# Patient Record
Sex: Male | Born: 1995
Health system: Southern US, Community
[De-identification: ages and names within clinical notes are randomized; demographics above are authoritative.]

## PROBLEM LIST (undated history)

## (undated) DIAGNOSIS — I10 Essential (primary) hypertension: Secondary | ICD-10-CM

## (undated) HISTORY — PX: TONSILLECTOMY AND ADENOIDECTOMY: SUR1326

---
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
## 2015-08-01 ENCOUNTER — Ambulatory Visit (INDEPENDENT_AMBULATORY_CARE_PROVIDER_SITE_OTHER): Payer: BLUE CROSS/BLUE SHIELD | Admitting: Primary Care

## 2015-08-01 ENCOUNTER — Encounter: Payer: Self-pay | Admitting: Primary Care

## 2015-08-01 VITALS — BP 136/84 | HR 83 | Temp 97.9°F | Ht 71.0 in | Wt 162.1 lb

## 2015-08-01 DIAGNOSIS — S76301A Unspecified injury of muscle, fascia and tendon of the posterior muscle group at thigh level, right thigh, initial encounter: Secondary | ICD-10-CM

## 2015-08-01 DIAGNOSIS — T1490XA Injury, unspecified, initial encounter: Secondary | ICD-10-CM | POA: Insufficient documentation

## 2015-08-01 MED ORDER — TRAMADOL HCL 50 MG PO TABS: 50.0000 mg | ORAL_TABLET | Freq: Three times a day (TID) | ORAL | Status: DC | PRN

## 2015-08-01 NOTE — Patient Instructions (Addendum)
Please schedule an appointment with Dr. Patsy Lager tomorrow.  Keep the ACE bandage around your leg for compression.  Apply ice and elevate your leg.   You can take Tramadol three times daily as needed for pain.  Please stay off of your leg today if possible. If the pain gets worse, you noticed an increase in bulge then go to the emergency department.  Please schedule a physical with me in 3 months. You may also schedule a lab only appointment 3-4 days prior. We will discuss your lab results in detail during your physical.  It was a pleasure to meet you today! Please don't hesitate to call me with any questions. Welcome to [facility]!

## 2015-08-01 NOTE — Progress Notes (Signed)
   Subjective:    Patient ID: Anthony Hendricks, male    DOB: 1995-11-22, 20 y.o.   MRN: [id number]  HPI  Anthony Hendricks is a 20 year old male who presents today to establish care and discuss the problems mentioned below. Will obtain old records. His last physical was years ago.   1) Leg pain: Located to the right upper left leg, hamstring region. He first noticed his pain with swelling suddenly after turning incorrectly while playing basketball this past Sunday.  He continued to apply weight on his leg that day and has since. He's noticed increased swelling and pain with a small "knot" to the posterior surface. He develops pain with any pressure or touch to his leg. He's tried applying ice and stretching. He's taken advil/ibuprofen without improvement. His pain is worse.  Review of Systems  Constitutional: Negative for unexpected weight change.  HENT: Negative for rhinorrhea.   Respiratory: Negative for cough and shortness of breath.   Cardiovascular: Negative for chest pain.  Gastrointestinal: Negative for diarrhea and constipation.  Genitourinary: Negative for difficulty urinating.  Musculoskeletal:       Right hamstring pain, swelling.  Skin: Negative for rash.  Neurological: Negative for numbness.       No past medical history on file.  Social History   Social History  . Marital Status: Single    Spouse Name: N/A  . Number of Children: N/A  . Years of Education: N/A   Occupational History  . Not on file.   Social History Main Topics  . Smoking status: Never Smoker   . Smokeless tobacco: Not on file  . Alcohol Use: 0.0 oz/week    0 Standard drinks or equivalent per week     Comment: social  . Drug Use: No  . Sexual Activity: Not on file   Other Topics Concern  . Not on file   Social History Narrative   Single.   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   Highest level of education 10th grade.   Enjoys playing basketball.    Past Surgical History  Procedure Laterality Date    . Tonsillectomy and adenoidectomy      Family History  Problem Relation Age of Onset  . Diabetes Mother   . Diabetes Sister     No Known Allergies  No current outpatient prescriptions on file prior to visit.   No current facility-administered medications on file prior to visit.    BP 136/84 mmHg  Pulse 83  Temp(Src) 97.9 F (36.6 C) (Oral)  Ht  (1.803 m)  Wt 162 lb 1.9 oz (73.537 kg)  BMI 22.62 kg/m2  SpO2 98%    Objective:   Physical Exam  Constitutional: He is oriented to person, place, and time. He appears well-nourished.  Appears in moderate pain with right leg injury  Cardiovascular: Normal rate and regular rhythm.   Pulmonary/Chest: Effort normal and breath sounds normal.  Musculoskeletal:       Right upper leg: He exhibits tenderness, swelling and deformity.  Obvious 3 cm bulge to right posterior upper leg. Represents injured muscle. Tender upon palpation.  Neurological: He is alert and oriented to person, place, and time.  Skin: Skin is warm and dry. No erythema.  Psychiatric: He has a normal mood and affect.          Assessment & Plan:

## 2015-08-01 NOTE — Progress Notes (Signed)
Pre visit review using our clinic review tool, if applicable. No additional management support is needed unless otherwise documented below in the visit note. 

## 2015-08-01 NOTE — Assessment & Plan Note (Signed)
Present to right posterior upper leg (hamstring region) since playing basketball.  Stat referral to ortho placed today and appointment at 2:30 pm this afternoon. He then reports that he cannot afford to see a specialist, and also declines evaluation at hospital with [place] order.  Offered for him to see Dr. Patsy Lager tomorrow, although I stressed that he see the orthopedist today, or at least allow me to order an ultrasound. He continues to decline but is ok to see Dr. Patsy Lager tomorrow and has scheduled an appointment for 9:45 am.   Strict instructions provided to patient. Offered crutches to keep weight off of leg, he declines. Leg wrapped in an ACE bandage for support. Discussed ice and elevation. RX for Tramadol provided to use PRN. Advised he not work until see by Dr. Patsy Lager tomorrow, he refuses and will be going back to work this afternoon. Note provided requiring non weight bearing work/light duty.  ED precautions provided. He verbalized understanding of the possible severity of his condition.

## 2015-08-02 ENCOUNTER — Encounter: Payer: Self-pay | Admitting: Family Medicine

## 2015-08-02 ENCOUNTER — Ambulatory Visit (INDEPENDENT_AMBULATORY_CARE_PROVIDER_SITE_OTHER): Payer: BLUE CROSS/BLUE SHIELD | Admitting: Family Medicine

## 2015-08-02 VITALS — BP 128/80 | HR 104 | Temp 98.2°F | Ht 71.0 in | Wt 164.2 lb

## 2015-08-02 DIAGNOSIS — S76311A Strain of muscle, fascia and tendon of the posterior muscle group at thigh level, right thigh, initial encounter: Secondary | ICD-10-CM | POA: Diagnosis not present

## 2015-08-02 NOTE — Patient Instructions (Signed)
Start in about 7-10 days  Hamstring Rehab 1)  HS curls - start with 3 sets of 15 and progress to 3 sets of 30 every 3 days 2)  HS curls with weight - begin with 2 lb ankle weight when above is easy and start with 3 sets of 10; increasing every 5 days by 5 reps - eg to 3 sets of 15 reps 3)  HS swings - swing leg backwards and curl at the end of the swing; follow same schedule as above. 4)  HS running lunges - running lunge position means no more than 45 degrees of knee flexion and running motion.  follow same schedule as above.

## 2015-08-02 NOTE — Progress Notes (Signed)
Dr. Karleen Hampshire T. Deshaun Schou, MD, CAQ [facility] [address] [place], [address] Phone: [phone] Fax: [phone]  08/02/2015  Patient: Arul Farabee, MRN: [id number], DOB: June 27, 1995, 20 y.o.  Primary Physician:  Morrie Sheldon, NP   Chief Complaint  Patient presents with  . Muscle Injury    Right Leg-Hurt while playing basketball   Subjective:   Kurk Corniel is a 21 y.o. 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 Felt a lump immediately, but he was able to continue playing basketball and finished the game.   Next day, he went to work. Leg pain got 10 x worse. "Got horrible, bad."  He has had minimal bruising, and pain is centered around the small medial defect on the right. He is limping currently.   He has been icing a lot and adding compression with an ace bandage.   Past Medical History, Surgical History, Social History, Family History, Problem List, Medications, and Allergies have been reviewed and updated if relevant.  Patient Active Problem List   Diagnosis Date Noted  . Muscle injury 08/01/2015    No past medical history on file.  Past Surgical History  Procedure Laterality Date  . Tonsillectomy and adenoidectomy      Social History   Social History  . Marital Status: Single    Spouse Name: N/A  . Number of Children: N/A  . Years of Education: N/A   Occupational History  . Not on file.   Social History Main Topics  . Smoking status: Never Smoker   . Smokeless tobacco: Never Used  . Alcohol Use: 0.0 oz/week    0 Standard drinks or equivalent per week     Comment: social  . Drug Use: No  . Sexual Activity: Not on file   Other Topics  Concern  . Not on file   Social History Narrative   Single.   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   Highest level of education 10th grade.   Enjoys playing basketball.    Family History  Problem Relation Age of Onset  . Diabetes Mother   . Diabetes Sister     No Known Allergies  Medication list reviewed and updated in full in [facility].  GEN: No fevers, chills. Nontoxic. Primarily MSK c/o today. MSK: Detailed in the HPI GI: tolerating PO intake without difficulty Neuro: No numbness, parasthesias, or tingling associated. Otherwise the pertinent positives of the ROS are noted above.   Objective:   BP 128/80 mmHg  Pulse 104  Temp(Src) 98.2 F (36.8 C) (Oral)  Ht  (1.803 m)  Wt 164 lb 4 oz (74.503 kg)  BMI 22.92 kg/m2   GEN: WDWN, NAD, Non-toxic, Alert & Oriented x 3 HEENT: Atraumatic, Normocephalic.  Ears and Nose: No external deformity. EXTR: No clubbing/cyanosis/edema NEURO: mild antalgia PSYCH: Normally interactive. Conversant. Not depressed or anxious appearing.  Calm demeanor.    Full ROM at the hip and knee.   Proximal HS is nontender throughout on the R The entirety of the biceps femoris is palpated, and there is no pain at all laterally whatsoever. Entirety of initial tuberosity is nontender.  Medial proximal hamstring is nontender throughout.  Distal semimembranosus and semitendinosus are also  nontender at their insertion points.  Medial hamstring is tender and there is some slight bruising adjacent to the palpable small defect is approximately 2 and a half centimeters across.  4/5 Concentric strength testing at 90. 4/5 eccentric strength testing at 25 degrees  Radiology: No results found.  Assessment and Plan:   Partial hamstring tear, right, initial encounter   Grade 2 hamstring tear, probable semitendinosis.  He has improved within a few days, and he is able to bear weight with minimal limp now. His strength from a concentric and  eccentric standpoint are better than I would have anticipated, and I think that he will do well.  Recommended neoprene compression sleeve for his thigh. I'll out to return to work, avoiding heavy lifting.  No stretching for at least 3 weeks. Basic hamstring rehabilitation in approximately 10 days, and at 19, I would expect him to do well. If he has any problems at all, asked him to follow-up with me in the upcoming weeks.  I appreciate the opportunity to evaluate this very friendly patient. If you have any question regarding her care or prognosis, do not hesitate to ask.   Follow-up: if needed  Patient Instructions  Start in about 7-10 days  Hamstring Rehab 1)  HS curls - start with 3 sets of 15 and progress to 3 sets of 30 every 3 days 2)  HS curls with weight - begin with 2 lb ankle weight when above is easy and start with 3 sets of 10; increasing every 5 days by 5 reps - eg to 3 sets of 15 reps 3)  HS swings - swing leg backwards and curl at the end of the swing; follow same schedule as above. 4)  HS running lunges - running lunge position means no more than 45 degrees of knee flexion and running motion.  follow same schedule as above.      Signed,  Elpidio Galea. Nalleli Largent, MD   Patient's Medications  New Prescriptions   No medications on file  Previous Medications   TRAMADOL (ULTRAM) 50 MG TABLET    Take 1 tablet (50 mg total) by mouth every 8 (eight) hours as needed for severe pain.  Modified Medications   No medications on file  Discontinued Medications   No medications on file

## 2015-08-02 NOTE — Progress Notes (Signed)
Pre visit review using our clinic review tool, if applicable. No additional management support is needed unless otherwise documented below in the visit note. 

## 2015-08-07 ENCOUNTER — Ambulatory Visit: Payer: Self-pay | Admitting: Primary Care

## 2015-08-18 ENCOUNTER — Emergency Department
Admission: EM | Admit: 2015-08-18 | Discharge: 2015-08-18 | Disposition: A | Discharge status: Home / Self Care | Payer: BLUE CROSS/BLUE SHIELD | Attending: Emergency Medicine | Admitting: Emergency Medicine

## 2015-08-18 ENCOUNTER — Telehealth: Payer: Self-pay | Admitting: Internal Medicine

## 2015-08-18 ENCOUNTER — Encounter: Payer: Self-pay | Admitting: Emergency Medicine

## 2015-08-18 DIAGNOSIS — R21 Rash and other nonspecific skin eruption: Secondary | ICD-10-CM | POA: Diagnosis present

## 2015-08-18 DIAGNOSIS — B09 Unspecified viral infection characterized by skin and mucous membrane lesions: Secondary | ICD-10-CM | POA: Diagnosis not present

## 2015-08-18 MED ORDER — VALACYCLOVIR HCL 1 G PO TABS: 1000.0000 mg | ORAL_TABLET | Freq: Two times a day (BID) | ORAL | Status: DC

## 2015-08-18 NOTE — Telephone Encounter (Signed)
[facility] Client TELEPHONE ADVICE RECORD [facility]  Patient Name: Anthony Hendricks  DOB: [birth year]    Initial Comment Caller States light migraines, hot/chills, face/neck/legs breaking out in what looks like pimples over night.    Nurse Assessment  Nurse: Phylliss Bobowe, RN, Synetta FailAnita Date/Time (Eastern Time): 08/18/2015 3:53:04 PM  Confirm and document reason for call. If symptomatic, describe symptoms. You must click the next button to save text entered. ---Caller States light migraines, hot/chills, face/neck/legs breaking out in what looks like pimples over night. caller stated that he has been having fever that started on Wednesday and has headache and has been warm but did not take his temp and then that evening he was fine and then he vomited all night and has been having the same symptoms and has been having temp but not sure of amount of fever and has vomited last on Thursday and has been able to take fluids fairly well and has had no diarrhea and has had stomach cramps and has generalized discomfort and has the headache today 3/10  Has the patient traveled out of the country within the last 30 days? ---No  Does the patient have any new or worsening symptoms? ---Yes  Will a triage be completed? ---Yes  Related visit to physician within the last 2 weeks? ---No  Does the PT have any chronic conditions? (i.e. diabetes, asthma, etc.) ---No  Is this a behavioral health or substance abuse call? ---No     Guidelines    Guideline Title Affirmed Question Affirmed Notes  Influenza - Seasonal Fever present > 3 days (72 hours)    Final Disposition User   See Physician within 24 Hours Rowe, RN, [facility]    Referrals  [facility]   Disagree/Comply: Comply

## 2015-08-18 NOTE — ED Provider Notes (Signed)
[facility] Emergency Department Provider Note  ____________________________________________  Time seen: On arrival  I have reviewed the triage vital signs and the nursing notes.   HISTORY  Chief Complaint Rash    HPI Anthony Hendricks is a 20 y.o. male who presents with complaints of a rash. Patient reports 3 days ago he felt ill with fevers and chills and myalgias. Yesterday he developed a rash that is itchy and primarily on his trunk. He denies fevers or chills. He reports his viral symptoms have mostly abated. No recent travel    History reviewed. No pertinent past medical history.  Patient Active Problem List   Diagnosis Date Noted  . Muscle injury 08/01/2015    Past Surgical History  Procedure Laterality Date  . Tonsillectomy and adenoidectomy      Current Outpatient Rx  Name  Route  Sig  Dispense  Refill  . traMADol (ULTRAM) 50 MG tablet   Oral   Take 1 tablet (50 mg total) by mouth every 8 (eight) hours as needed for severe pain.   30 tablet   0   . valACYclovir (VALTREX) 1000 MG tablet   Oral   Take 1 tablet (1,000 mg total) by mouth 2 (two) times daily.   14 tablet   0     Allergies Review of patient's allergies indicates no known allergies.  Family History  Problem Relation Age of Onset  . Diabetes Mother   . Diabetes Sister     Social History Social History  Substance Use Topics  . Smoking status: Never Smoker   . Smokeless tobacco: Never Used  . Alcohol Use: 0.0 oz/week    0 Standard drinks or equivalent per week     Comment: social    Review of Systems  Constitutional: Negative for fever. Eyes: Negative for visual changes. ENT: Negative for sore throat    Musculoskeletal: Negative for back pain. Negative for neck pain Skin: As above Neurological: Negative for headaches or focal weakness   ____________________________________________   PHYSICAL EXAM:  VITAL SIGNS: ED Triage Vitals  Enc Vitals Group      BP 08/18/15 1734 160/105 mmHg     Pulse Rate 08/18/15 1734 90     Resp 08/18/15 1734 18     Temp 08/18/15 1734 98.9 F (37.2 C)     Temp Source 08/18/15 1734 Oral     SpO2 08/18/15 1734 100 %     Weight 08/18/15 1734 164 lb (74.39 kg)     Height 08/18/15 1734 5\' 11"  (1.803 m)     Head Cir --      Peak Flow --      Pain Score 08/18/15 1735 8     Pain Loc --      Pain Edu? --      Excl. in GC? --      Constitutional: Alert and oriented. Well appearing and in no distress. Eyes: Conjunctivae are normal.  ENT   Head: Normocephalic and atraumatic.   Mouth/Throat: Mucous membranes are moist. Cardiovascular: Normal rate, regular rhythm.  Respiratory: Normal respiratory effort without tachypnea nor retractions.  Gastrointestinal: Soft and non-tender in all quadrants. No distention. There is no CVA tenderness. Musculoskeletal: Nontender with normal range of motion in all extremities. Neurologic:  Normal speech and language. No gross focal neurologic deficits are appreciated. Skin:  Skin is warm, dry and intact. Patient with macular rash, several areas with papules with yellowish fluid. Psychiatric: Mood and affect are normal. Patient exhibits appropriate  insight and judgment.  ____________________________________________    LABS (pertinent positives/negatives)  Labs Reviewed - No data to display  ____________________________________________     ____________________________________________    RADIOLOGY I have personally reviewed any xrays that were ordered on this patient: None  ____________________________________________   PROCEDURES  Procedure(s) performed: none   ____________________________________________   INITIAL IMPRESSION / ASSESSMENT AND PLAN / ED COURSE  Pertinent labs & imaging results that were available during my care of the patient were reviewed by me and considered in my medical decision making (see chart for details).  Patient  well-appearing and nontoxic. His vital signs are unremarkable. He is comfortable. His is somewhat similar to chickenpox, he does not know if he has ever had it. Also could be a viral exanthem/viral rash. I will treat with valcyclovir and he will follow up his PCP. Return Precautions discussed  ____________________________________________   FINAL CLINICAL IMPRESSION(S) / ED DIAGNOSES  Final diagnoses:  Viral rash     Jene Every, MD 08/18/15 [phone]

## 2015-08-18 NOTE — ED Notes (Signed)
Reports fatigue, bodyaches and rash all over body.

## 2015-08-18 NOTE — ED Notes (Signed)
MD Kinner at bedside  

## 2015-08-23 ENCOUNTER — Telehealth: Payer: Self-pay | Admitting: Primary Care

## 2015-08-23 NOTE — Telephone Encounter (Signed)
Pt has chicken pox and wants to know when he is safe to go back to work cb number is [phone] Thanks

## 2015-08-23 NOTE — Telephone Encounter (Signed)
Called and notified patient of Kate's comments. Patient stated that most of spots have yellow in the center of it. Notified the patient that the spots need to be scrabbing over then he will be safe to go back to work. Patient verbalized understand.

## 2015-08-23 NOTE — Telephone Encounter (Signed)
He may return to school as long as he has no open vesicles.

## 2015-08-25 ENCOUNTER — Encounter: Payer: Self-pay | Admitting: Primary Care

## 2015-08-25 ENCOUNTER — Ambulatory Visit (INDEPENDENT_AMBULATORY_CARE_PROVIDER_SITE_OTHER): Payer: BLUE CROSS/BLUE SHIELD | Admitting: Primary Care

## 2015-08-25 VITALS — BP 124/80 | HR 73 | Temp 98.4°F | Ht 71.0 in | Wt 159.1 lb

## 2015-08-25 DIAGNOSIS — T1490XA Injury, unspecified, initial encounter: Secondary | ICD-10-CM

## 2015-08-25 DIAGNOSIS — R21 Rash and other nonspecific skin eruption: Secondary | ICD-10-CM | POA: Diagnosis not present

## 2015-08-25 MED ORDER — TRIAMCINOLONE ACETONIDE 0.1 % EX CREA: 1.0000 "application " | TOPICAL_CREAM | Freq: Two times a day (BID) | CUTANEOUS | Status: DC

## 2015-08-25 NOTE — Progress Notes (Signed)
Pre visit review using our clinic review tool, if applicable. No additional management support is needed unless otherwise documented below in the visit note. 

## 2015-08-25 NOTE — Patient Instructions (Signed)
Your rash represents a condition called seborrheic dermatitis. This is not uncommon and can be treated several ways.  1. Start shampooing the affected area everyday with head and shoulders. Give this 2 weeks.  2. If no improvement with head and shoulders then you may apply the triamcinolone cream twice daily. Please wash your hands after use of this medication.  Please email me if no improvement.  It was a pleasure to see you today!  Seborrheic Dermatitis Seborrheic dermatitis involves pink or red skin with greasy, flaky scales. It usually occurs on the scalp, and it is often called dandruff. This condition may also affect the eyebrows, nose, ears, chest, and the bearded area of men's faces. It often occurs where skin has more oil (sebaceous) glands. It may come and go for no known reason, and it is often long-lasting (chronic). CAUSES The cause is not known. RISK FACTORS This condition is more like to develop in:  People who are stressed or tired.  People who have skin conditions, such as acne.  People who have certain conditions, such as:  HIV (human immunodeficiency virus).  AIDS (acquired immunodeficiency syndrome).  Parkinson disease.  An eating disorder.  Stroke.  Depression.  Epilepsy.  Alcoholism.  People who live in places that have extreme weather.  People who have a family history of seborrheic dermatitis.  People who use skin creams that are made with alcohol.  People who are 1330-22 years old.  People who take certain medicines. SYMPTOMS Symptoms of this condition include:  Thick scales on the scalp.  Redness on the face or in the armpits.  Skin that is flaky. The flakes may be white or yellow.  Skin that seems oily or dry but is not helped with moisturizers.  Itching or burning in the affected areas. DIAGNOSIS This condition is diagnosed with a medical history and physical exam. A sample of your skin may be tested (skin biopsy). You may need to  see a skin specialist (dermatologist). TREATMENT There is no cure for this condition, but treatment can help to manage the symptoms. Treatment may include:  Cortisone (steroid) ointments, creams, and lotions.  Over-the-counter or prescription shampoos. HOME CARE INSTRUCTIONS  Apply over-the-counter and prescription medicines only as told by your health care provider.  Keep all follow-up visits as told by your health care provider. This is important.  Try to reduce your stress, such as with yoga or mediation. If you need help to reduce stress, ask your health care provider.  Shower or bathe as told by your health care provider.  Use any medicated shampoos as told by your health care provider. SEEK MEDICAL CARE IF:  Your symptoms do not improve with treatment.  Your symptoms get worse.  You have new symptoms.   This information is not intended to replace advice given to you by your health care provider. Make sure you discuss any questions you have with your health care provider.   Document Released: 06/03/2005 Document Revised: 02/22/2015 Document Reviewed: 10/19/2014 Elsevier Interactive Patient Education [employer]2016 Elsevier Inc.

## 2015-08-25 NOTE — Progress Notes (Signed)
   Subjective:    Patient ID: Anthony Hendricks, male    DOB: [birth year], 20 y.o.   MRN: [id number]  HPI  Mr. Smelcer is a 20 year old male who presents today to discuss his recent diagnosis of chicken pox. [facility]15 with complaints of rash with fevers, chills, nausea/vomiting, and myalgias. His rash is itchy in nature. He was diagnosed with chicken pox that day and was treated with Valcyclovir.   Since his ED visit he's been compliant with his medication and has one dose left of his medication. Denies itching, fevers, body aches, vomiting. He is wanting clearance to return to work.  2) Rash/Dry Skin: Located to nasal folds, eyebrows, and left lower cheek. Chronic for 2 years. This dryness is constantly present and will wax and wane in intensity for 2 weeks at a time. The rash will be mildly itchy, very flaky with dryness. He's applied calamine lotion, tried washing his face with OTC acne products such as benzyl peroxide without improvement. He has a history of dandruff to his cranium and showers with head and shoulders.     Review of Systems  Constitutional: Negative for fever.  Gastrointestinal: Negative for nausea.  Musculoskeletal: Negative for myalgias.  Skin: Positive for rash.       No past medical history on file.  Social History   Social History  . Marital Status: Single    Spouse Name: N/A  . Number of Children: N/A  . Years of Education: N/A   Occupational History  . Not on file.   Social History Main Topics  . Smoking status: Never Smoker   . Smokeless tobacco: Never Used  . Alcohol Use: 0.0 oz/week    0 Standard drinks or equivalent per week     Comment: social  . Drug Use: No  . Sexual Activity: Not on file   Other Topics Concern  . Not on file   Social History Narrative   Single.   Works in [employer].   Highest level of education 10th grade.   Enjoys playing basketball.    Past Surgical History  Procedure  Laterality Date  . Tonsillectomy and adenoidectomy      Family History  Problem Relation Age of Onset  . Diabetes Mother   . Diabetes Sister     No Known Allergies  No current outpatient prescriptions on file prior to visit.   No current facility-administered medications on file prior to visit.    BP 124/80 mmHg  Pulse 73  Temp(Src) 98.4 F (36.9 C) (Oral)  Ht 5\' 11"  (1.803 m)  Wt 159 lb 1.9 oz (72.176 kg)  BMI 22.20 kg/m2  SpO2 98%    Objective:   Physical Exam  Constitutional: He appears well-nourished.  Cardiovascular: Normal rate and regular rhythm.   Pulmonary/Chest: Effort normal and breath sounds normal.  Skin:  Papular rash to nearly entire body representing chicken pox that is healing. No open lesions or s/s of infection.  Moderate dry skin noted to nose, nasal folds, eyebrows. Appears to be seborrheic dermatitis. No erythema, open wound.           Assessment & Plan:  Chicken Pox:  Evidence of healing rash representative of chicken pox. No open lesions, some crusted vesicles. Appears well nourished and does not appear ill. Will clear patient to return to work.

## 2015-08-26 DIAGNOSIS — L309 Dermatitis, unspecified: Secondary | ICD-10-CM | POA: Insufficient documentation

## 2015-08-26 NOTE — Assessment & Plan Note (Signed)
Saw Dr. Patsy Lageropland and is overall improved.  Denies pain. Ambulatory without difficulty.

## 2015-08-26 NOTE — Assessment & Plan Note (Signed)
Moderate dry skin with flaking to nose, eyebrows, and left cheek.  Appears to be seborheic dermatitis.  Will have him was nose daily with head and shoulders; if no improvement, sent low dose Triamcinolone to treat. He is to keep me updated.

## 2016-03-08 ENCOUNTER — Telehealth: Payer: Self-pay | Admitting: Primary Care

## 2016-03-08 ENCOUNTER — Encounter: Payer: Self-pay | Admitting: Primary Care

## 2016-03-08 ENCOUNTER — Ambulatory Visit (INDEPENDENT_AMBULATORY_CARE_PROVIDER_SITE_OTHER): Payer: BLUE CROSS/BLUE SHIELD | Admitting: Primary Care

## 2016-03-08 VITALS — BP 136/78 | HR 88 | Temp 97.8°F | Ht 71.0 in | Wt 168.8 lb

## 2016-03-08 DIAGNOSIS — R631 Polydipsia: Secondary | ICD-10-CM | POA: Diagnosis not present

## 2016-03-08 DIAGNOSIS — R451 Restlessness and agitation: Secondary | ICD-10-CM

## 2016-03-08 DIAGNOSIS — R41840 Attention and concentration deficit: Secondary | ICD-10-CM

## 2016-03-08 DIAGNOSIS — R21 Rash and other nonspecific skin eruption: Secondary | ICD-10-CM

## 2016-03-08 LAB — CBC
HCT: 46.3 % (ref 39.0–52.0)
HEMOGLOBIN: 15.7 g/dL (ref 13.0–17.0)
MCHC: 33.8 g/dL (ref 30.0–36.0)
MCV: 82.6 fl (ref 78.0–100.0)
Platelets: 253 10*3/uL (ref 150.0–400.0)
RBC: 5.61 Mil/uL (ref 4.22–5.81)
RDW: 13.1 % (ref 11.5–15.5)
WBC: 6.5 10*3/uL (ref 4.0–10.5)

## 2016-03-08 LAB — COMPREHENSIVE METABOLIC PANEL
ALT: 30 U/L (ref 0–53)
AST: 21 U/L (ref 0–37)
Albumin: 4.8 g/dL (ref 3.5–5.2)
Alkaline Phosphatase: 57 U/L (ref 39–117)
BILIRUBIN TOTAL: 0.6 mg/dL (ref 0.2–1.2)
BUN: 15 mg/dL (ref 6–23)
CO2: 29 meq/L (ref 19–32)
Calcium: 9.6 mg/dL (ref 8.4–10.5)
Chloride: 102 mEq/L (ref 96–112)
Creatinine, Ser: 0.98 mg/dL (ref 0.40–1.50)
GFR: 101.34 mL/min (ref >60.00)
GLUCOSE: 96 mg/dL (ref 70–99)
POTASSIUM: 4.3 meq/L (ref 3.5–5.1)
SODIUM: 139 meq/L (ref 135–145)
Total Protein: 7.2 g/dL (ref 6.0–8.3)

## 2016-03-08 LAB — HEMOGLOBIN A1C: Hgb A1c MFr Bld: 5.2 % (ref 4.6–6.5)

## 2016-03-08 LAB — TSH: TSH: 0.89 u[IU]/mL (ref 0.35–4.50)

## 2016-03-08 NOTE — Telephone Encounter (Signed)
Spoke to pt. He will call [facility] to set up formal ADHD testing. Pt did say he may not go through with testing b/c he has 5000 deductible and wanted me to let Jae DireKate know.

## 2016-03-08 NOTE — Patient Instructions (Signed)
Complete lab work prior to leaving today. I will notify you of your results once received.   You will be contacted regarding your referral for ADHD testing.  Please let us know if you have not heard back within one week.   It was a pleasure to see you today!

## 2016-03-08 NOTE — Assessment & Plan Note (Signed)
Diagnosis of ADHD in a group home as a teenager, no recent re-evaluation. No history of medication use. Symptoms today represent ADHD, however, would like him formally tested. GAD 7 score of 9. Denies symptoms of depression. Will check other labs including TSH, CBC, CMP to rule out metabolic cause of symptoms. Referral placed for formal testing.

## 2016-03-08 NOTE — Assessment & Plan Note (Signed)
Improved with triamcinolone cream. Uses several times weekly.

## 2016-03-08 NOTE — Progress Notes (Signed)
Subjective:    Patient ID: Anthony Hendricks, male    DOB: 1996/01/25, 20 y.o.   MRN: [id number]  HPI  Anthony Hendricks is a 20 year old male who presents today with multiple complaints.  1) ADHD: Diagnosed while in a group home in early teen years. He doesn't ever remember being managed on medications. He's experiencing difficulty concentrating, his mind wanders when he's asked to focus on a specific task, he is very restless as he constantly moves his legs and arms, he experiences blurred vision when his mind wanders.   He struggled in school as a child and teenage as he could not pay attention in class. He dropped out of school in his mid teenage years given those reasons. GAD 7 score of 9. Denies symptoms of depression, drug or alcohol use.   2) Family History of Diabetes: Strong family history of diabetes in his Brother, Sister, Mother, Wonda OldsCousin, Maternal Grandmother. His mother and maternal grandmother are on insulin. He does experience polyuria, polydipsia. He is concerned he may have diabetes given his symptoms mentioned above. He does feel jittery once daily which improves after eating a candy bar.  He endorses a poor diet which consists of: Breakfast: Nothing Lunch: Oatmeal Dinner: Frozen meals, pasta, box dinners Snacks: None Desserts: Candy, ice cream. Frequently. Beverages: Water, juice    Review of Systems  Constitutional: Negative for fatigue and unexpected weight change.  Respiratory: Negative for shortness of breath.   Cardiovascular: Negative for chest pain.  Endocrine: Positive for polydipsia and polyuria. Negative for cold intolerance and heat intolerance.  Genitourinary: Negative for dysuria and frequency.  Psychiatric/Behavioral: Positive for decreased concentration. Negative for suicidal ideas. The patient is nervous/anxious.        No past medical history on file.   Social History   Social History  . Marital status: Single    Spouse name: N/A  . Number of  children: N/A  . Years of education: N/A   Occupational History  . Not on file.   Social History Main Topics  . Smoking status: Never Smoker  . Smokeless tobacco: Never Used  . Alcohol use 0.0 oz/week     Comment: social  . Drug use: No  . Sexual activity: Not on file   Other Topics Concern  . Not on file   Social History Narrative   Single.   Works in [employer].   Highest level of education 10th grade.   Enjoys playing basketball.    Past Surgical History:  Procedure Laterality Date  . TONSILLECTOMY AND ADENOIDECTOMY      Family History  Problem Relation Age of Onset  . Diabetes Mother   . Diabetes Sister     No Known Allergies  Current Outpatient Prescriptions on File Prior to Visit  Medication Sig Dispense Refill  . triamcinolone cream (KENALOG) 0.1 % Apply 1 application topically 2 (two) times daily. 30 g 0   No current facility-administered medications on file prior to visit.     BP 136/78   Pulse 88   Temp 97.8 F (36.6 C) (Oral)   Ht 5\' 11"  (1.803 m)   Wt 168 lb 12.8 oz (76.6 kg)   SpO2 95%   BMI 23.54 kg/m    Objective:   Physical Exam  Constitutional: He appears well-nourished.  Neck: Neck supple.  Cardiovascular: Normal rate and regular rhythm.   Pulmonary/Chest: Effort normal and breath sounds normal.  Skin: Skin is warm and dry.  Psychiatric: He has a  normal mood and affect.  Restless during exam, fidgety.           Assessment & Plan:

## 2016-03-08 NOTE — Progress Notes (Signed)
Pre visit review using our clinic review tool, if applicable. No additional management support is needed unless otherwise documented below in the visit note. 

## 2016-03-08 NOTE — Assessment & Plan Note (Signed)
Also with polyuria, jittery feeling. Strong FH of diabetes in all direct relatives. Suspect he does not have diabetes, but will check as he seems anxious. His symptoms are likely related to anxiety/ADHD, but will check A1C, and other tests to rule out metabolic cause.

## 2016-03-11 NOTE — Telephone Encounter (Signed)
Please notify patient that if I would very much prefer him to be formally tested through the services suggested. Have him call his insurance company for information regarding his co-pay amount.  If he's unable to afford, please tell him to notify me and I'll come up with a way to help him. Tell him that we will figure out a way to get him tested and treated.

## 2016-03-12 NOTE — Telephone Encounter (Signed)
Spoken and notified patient of Kate's comments. Patient stated that he did call his insurance and he would have to pay out of pocket if go which patient don't really have the money for. So patient stated is there anything else Jae DireKate may suggest for patient then.

## 2016-03-12 NOTE — Telephone Encounter (Signed)
Please notify patient that I have a form that I would like for him to complete. The form is in your inbox. He can come in and fill it out and hand it back to the front office staff. Once I review his results, I will be in contact with him.

## 2016-03-13 NOTE — Telephone Encounter (Signed)
Patient faxed back the form. Placed in Kate's inbox.

## 2016-03-13 NOTE — Telephone Encounter (Signed)
Refaxed the form.

## 2016-03-13 NOTE — Telephone Encounter (Signed)
Spoken and notified patient of Kate's comments. Patient verbalized understanding.  Patient request the form to be faxed to him at work.  Faxed the form to [phone]

## 2016-03-13 NOTE — Telephone Encounter (Signed)
An you re fax paperwork pt fax machine was down for a while Faxed # [phone]   Best phone# [phone]

## 2016-03-14 MED ORDER — BUPROPION HCL ER (XL) 150 MG PO TB24: 150.0000 mg | ORAL_TABLET | ORAL | 0 refills | Status: DC

## 2016-03-14 NOTE — Telephone Encounter (Signed)
Noted Rx sent to pharmacy  

## 2016-03-14 NOTE — Telephone Encounter (Signed)
Please notify patient that I have reviewed his results and concluded that he may benefit from treatment for symptoms of ADHD. I'd like to try Wellbutrin XL 150 mg. He is to take 1 tablet by mouth once daily every morning. If he's agreeable, notify me and i'll send this to his pharmacy. This medication takes time to reach it's full effect, sometimes 4 to 6 weeks, but he will notice gradual changes along the way. Possible side effects include nausea, dizziness, tremor, insomnia. I will need to see him back in the office for follow up in 6 weeks if I introduce this medication.

## 2016-03-14 NOTE — Addendum Note (Signed)
Addended by: Doreene NestLARK, Reneta Niehaus K on: 03/14/2016 11:43 AM   Modules accepted: Orders

## 2016-03-14 NOTE — Telephone Encounter (Signed)
Patient is agreeable to take the Wellbutrin. I inform patient if the med is expensive or not covered by insurance to let us know.

## 2016-03-15 ENCOUNTER — Telehealth: Payer: Self-pay | Admitting: Primary Care

## 2016-03-15 NOTE — Telephone Encounter (Signed)
For his ADHD symptoms.

## 2016-03-15 NOTE — Telephone Encounter (Signed)
Pt is requesting a call back in regards to the wellbutrin med he was prescribed. He wants to know why he is taking this meds.

## 2016-03-15 NOTE — Telephone Encounter (Signed)
Spoken and notified patient of Kate's comments. Patient verbalized understanding. 

## 2016-03-15 NOTE — Telephone Encounter (Signed)
What would you like to tell him?

## 2016-04-04 ENCOUNTER — Telehealth: Payer: Self-pay | Admitting: Primary Care

## 2016-04-04 NOTE — Telephone Encounter (Signed)
Noted  

## 2016-04-04 NOTE — Telephone Encounter (Signed)
Please see message below from Terri at [facility]    Terri L Slaugenhaupt, CCC-SLP>>  [employer]    FYI: We called your patient and he has declined to schedule an appointment with us, at this time.   We appreciate the referral.

## 2016-09-03 ENCOUNTER — Other Ambulatory Visit: Payer: Self-pay | Admitting: Primary Care

## 2016-09-03 DIAGNOSIS — R21 Rash and other nonspecific skin eruption: Secondary | ICD-10-CM

## 2016-09-03 NOTE — Telephone Encounter (Signed)
Ok to refill? Electronically refill request for triamcinolone cream (KENALOG) 0.1 %. Last prescribed on 08/25/2015. Last seen on 03/08/2016

## 2016-09-11 ENCOUNTER — Ambulatory Visit (INDEPENDENT_AMBULATORY_CARE_PROVIDER_SITE_OTHER): Payer: BLUE CROSS/BLUE SHIELD | Admitting: Primary Care

## 2016-09-11 ENCOUNTER — Encounter: Payer: Self-pay | Admitting: Primary Care

## 2016-09-11 DIAGNOSIS — R21 Rash and other nonspecific skin eruption: Secondary | ICD-10-CM

## 2016-09-11 DIAGNOSIS — R41840 Attention and concentration deficit: Secondary | ICD-10-CM | POA: Diagnosis not present

## 2016-09-11 NOTE — Progress Notes (Signed)
Pre visit review using our clinic review tool, if applicable. No additional management support is needed unless otherwise documented below in the visit note. 

## 2016-09-11 NOTE — Assessment & Plan Note (Signed)
Questionnaire completed in late September suggestive of ADHD with anxiety. Overall feeling less irritable with Wellbutrin, suspect this is helping overall anxiety. Will have him continue medication for an additional 3-4 weeks as he's not yet given it enough time to work. If no improvement in concentration, consider increasing dose or switching to Strattera.   Discussed that I would not be prescribing stimulants until he's formally tested, he verbalized understanding and is not interested in stimulants.

## 2016-09-11 NOTE — Assessment & Plan Note (Signed)
Significant improvement with triamcinolone cream.

## 2016-09-11 NOTE — Progress Notes (Signed)
   Subjective:    Patient ID: Anthony Hendricks, male    DOB: [birth year], 21 y.o.   MRN: [id number]  HPI  [name] is a 21 year old male who presents today to discuss ADHD.  He was last evaluated in September 2017 for complaints of difficulty concentrating. He refused to complete formal testing through behavorial health due to financial reasons. He completed a questionnaire in late September which showed evidence for ADHD. He was initiated on Wellbutrin XL 150 mg for ADHD and was instructed to follow up six weeks later.  He presents today for follow up. He initially took the medication for one week and stopped as he didn't like taking it. He thinks the medication caused him to have random thoughts. He resumed his medication three weeks ago. He does feel calmer and less irritable in general but doesn't think it's helping with concentration.   Now he's having difficulty focusing, he's not completing tasks at work, feeling stressed sometimes, getting in trouble at work for losing interest in his assigned activities. GAD 7 score of 14.   Review of Systems  Respiratory: Negative for shortness of breath.   Cardiovascular: Negative for chest pain and palpitations.  Psychiatric/Behavioral: Positive for decreased concentration and sleep disturbance. Negative for suicidal ideas. The patient is nervous/anxious.        No past medical history on file.   Social History   Social History  . Marital status: Single    Spouse name: N/A  . Number of children: N/A  . Years of education: N/A   Occupational History  . Not on file.   Social History Main Topics  . Smoking status: Never Smoker  . Smokeless tobacco: Never Used  . Alcohol use 0.0 oz/week     Comment: social  . Drug use: No  . Sexual activity: Not on file   Other Topics Concern  . Not on file   Social History Narrative   Single.   Works in [employer].   Highest level of education 10th grade.   Enjoys playing basketball.      Past Surgical History:  Procedure Laterality Date  . TONSILLECTOMY AND ADENOIDECTOMY      Family History  Problem Relation Age of Onset  . Diabetes Mother   . Diabetes Sister     No Known Allergies  Current Outpatient Prescriptions on File Prior to Visit  Medication Sig Dispense Refill  . buPROPion (WELLBUTRIN XL) 150 MG 24 hr tablet Take 1 tablet (150 mg total) by mouth every morning. 90 tablet 0  . triamcinolone cream (KENALOG) 0.1 % APPLY 1 APPLICATION TOPICALLY 2 (TWO) TIMES DAILY. 30 g 0   No current facility-administered medications on file prior to visit.     BP 136/80   Pulse 98   Temp 97.8 F (36.6 C) (Oral)   Ht 5\' 11"  (1.803 m)   Wt 165 lb (74.8 kg)   SpO2 98%   BMI 23.01 kg/m    Objective:   Physical Exam  Constitutional: He appears well-nourished.  Neck: Neck supple.  Cardiovascular: Normal rate and regular rhythm.   Skin: Skin is warm and dry.  Psychiatric: He has a normal mood and affect.  Fidgety and mildly anxious during exam. Cooperative, good eye contact.          Assessment & Plan:

## 2016-09-11 NOTE — Patient Instructions (Signed)
Continue bupropion 150 mg tablets for anxiety and ADHD.   Please message me in 3-4 weeks with an update as it takes about 6 weeks for this medication to reach its fullest effect.  Start exercising. You should be getting 150 minutes of moderate intensity exercise weekly. This will help with anxiety and stress.  You can try Melatonin tablets for sleep. Do not exceed 10 mg in 24 hours.  It was a pleasure to see you today!

## 2017-03-13 ENCOUNTER — Ambulatory Visit (INDEPENDENT_AMBULATORY_CARE_PROVIDER_SITE_OTHER): Payer: BLUE CROSS/BLUE SHIELD | Admitting: Family Medicine

## 2017-03-13 ENCOUNTER — Encounter: Payer: Self-pay | Admitting: Family Medicine

## 2017-03-13 ENCOUNTER — Telehealth: Payer: Self-pay | Admitting: Primary Care

## 2017-03-13 VITALS — BP 118/60 | HR 93 | Temp 98.3°F | Ht 71.0 in | Wt 162.5 lb

## 2017-03-13 DIAGNOSIS — T63444A Toxic effect of venom of bees, undetermined, initial encounter: Secondary | ICD-10-CM | POA: Diagnosis not present

## 2017-03-13 NOTE — Progress Notes (Signed)
  HPI:  Acute visit for bee sting: -Was mowing and got into a yellow jackets nest 2 days ago -Has redness and pain in the left arm where he was stung -No weakness, shortness of breath, swelling of the face tongue or lips, nausea, vomiting, fevers or malaise -he took some Benadryl yesterday  ROS: See pertinent positives and negatives per HPI.  No past medical history on file.  Past Surgical History:  Procedure Laterality Date  . TONSILLECTOMY AND ADENOIDECTOMY      Family History  Problem Relation Age of Onset  . Diabetes Mother   . Diabetes Sister     Social History   Social History  . Marital status: Single    Spouse name: N/A  . Number of children: N/A  . Years of education: N/A   Social History Main Topics  . Smoking status: Never Smoker  . Smokeless tobacco: Never Used  . Alcohol use 0.0 oz/week     Comment: social  . Drug use: No  . Sexual activity: Not Asked   Other Topics Concern  . None   Social History Narrative   Single.   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   Highest level of education 10th grade.   Enjoys playing basketball.     Current Outpatient Prescriptions:  .  triamcinolone cream (KENALOG) 0.1 %, APPLY 1 APPLICATION TOPICALLY 2 (TWO) TIMES DAILY., Disp: 30 g, Rfl: 0  EXAM:  Vitals:   03/13/17 1640  BP: 118/60  Pulse: 93  Temp: 98.3 F (36.8 C)  SpO2: 98%    Body mass index is 22.66 kg/m.  GENERAL: vitals reviewed and listed above, alert, oriented, appears well hydrated and in no acute distress  HEENT: atraumatic, conjunttiva clear, no obvious abnormalities on inspection of external nose and ears  NECK: no obvious masses on inspection  LUNGS: clear to auscultation bilaterally, no wheezes, rales or rhonchi, good air movement  CV: HRRR, no peripheral edema  MS: moves all extremities without noticeable abnormality, normal range of motion of the upper extremities throughout  SKIN: area of erythema covering the medial elbow  region with mild warmth and very mild edema, or streaking  PSYCH: pleasant and cooperative, no obvious depression or anxiety  ASSESSMENT AND PLAN:  Discussed the following assessment and plan:  Local reaction to bee sting, undetermined intent, initial encounter  -we discussed possible serious and likely etiologies, workup and treatment, treatment risks and return precautions - Most likely local reaction to bee sting without any systemic symptoms -after this discussion, Arie opted for  Antihistamine( Pepcid or Zyrtec daily), topical anti-itch/cream ointment as needed, elevation of arm and ice as needed -follow up advised as needed if worsening, new symptoms arise or is not improving with treatment  He declined AVS There are no Patient Instructions on file for this visit.  Kriste Basque R., DO

## 2017-03-13 NOTE — Telephone Encounter (Signed)
Pt has appt with Dr Kriste Basque 03/13/17 @ 4:45.

## 2017-03-13 NOTE — Telephone Encounter (Signed)
Patient Name: Anthony Hendricks DOB: [birth year] Initial Comment Caller states he had a yellow jacket sting 2 days ago, on the arm, the swelling has not gone down, and the redness is getting larger. He also go stung on his leg, but those Sx are almost gone. Nurse Assessment Nurse: Earlene Plater RN, Lupita Leash Date/Time Lamount Cohen Time): 03/13/2017 12:23:37 PM Confirm and document reason for call. If symptomatic, describe symptoms. ---Caller states he had a yellow jacket sting 2 days ago, on his left arm. the swelling has not gone down, and the redness is getting larger and warm to touch. He also go stung on his leg, but that is almost gone. No fever. Moves left hand. Did have rash after he got stung but it is now gone. Does the patient have any new or worsening symptoms? ---Yes Will a triage be completed? ---Yes Related visit to physician within the last 2 weeks? ---No Does the PT have any chronic conditions? (i.e. diabetes, asthma, etc.) ---No Is this a behavioral health or substance abuse call? ---No Guidelines Guideline Title Affirmed Question Affirmed Notes Bee or Yellow Jacket Sting [1] Red or very tender (to touch) area AND [2] started over 24 hours after the sting Final Disposition User See Physician within 4 Hours (or PCP triage) Earlene Plater, RN, Lupita Leash Comments Appointment scheduled for 4;45 at [facility]. Referrals REFERRED TO PCP OFFICE Caller Disagree/Comply Comply Caller Understands Yes PreDisposition Call Doctor

## 2017-03-28 ENCOUNTER — Other Ambulatory Visit: Payer: Self-pay | Admitting: Primary Care

## 2017-03-28 DIAGNOSIS — R21 Rash and other nonspecific skin eruption: Secondary | ICD-10-CM

## 2017-03-28 NOTE — Telephone Encounter (Signed)
Pt said with the weather change has redness, dry patches and face hurts; pt request refill triamcinolone cream to [facility]. Pt said he cannot afford to see dermatologist. Please advise.

## 2017-03-28 NOTE — Telephone Encounter (Signed)
Refills sent to pharmacy. 

## 2017-04-01 ENCOUNTER — Encounter: Payer: Self-pay | Admitting: Primary Care

## 2017-04-01 ENCOUNTER — Ambulatory Visit (INDEPENDENT_AMBULATORY_CARE_PROVIDER_SITE_OTHER): Payer: BLUE CROSS/BLUE SHIELD | Admitting: Primary Care

## 2017-04-01 VITALS — BP 120/76 | HR 68 | Temp 97.8°F | Ht 71.0 in | Wt 162.4 lb

## 2017-04-01 DIAGNOSIS — L309 Dermatitis, unspecified: Secondary | ICD-10-CM | POA: Diagnosis not present

## 2017-04-01 DIAGNOSIS — B354 Tinea corporis: Secondary | ICD-10-CM | POA: Diagnosis not present

## 2017-04-01 MED ORDER — CICLOPIROX OLAMINE 0.77 % EX CREA: TOPICAL_CREAM | Freq: Two times a day (BID) | CUTANEOUS | 0 refills | Status: DC

## 2017-04-01 NOTE — Assessment & Plan Note (Signed)
Intermittent improvement with low potency triamcinolone cream. Discussed risks for skin atrophy, he verbalized understanding and is using this sparingly during flares. Seems overall to be helping. Offered dermatology evaluation as he doesn't experience resolve, just improvement, he kindly declines.  Dry scalp suspect to be eczema based off of exam. Try Selsun Blue shampoo. He will update.

## 2017-04-01 NOTE — Assessment & Plan Note (Signed)
Located to anterior chest wall, also with one spot to right chin. Treat with ciclopirox BID x 1-2 weeks. He will update if no improvement.

## 2017-04-01 NOTE — Patient Instructions (Addendum)
Start using [employer], this can be purchased at [facility] or any drug store.  Apply the ciclopirox cream twice daily to the chest and spot on your face.  Continue the triamcinolone cream for facial eczema.   Please update me in 2 weeks if no improvement.  It was a pleasure to see you today!

## 2017-04-01 NOTE — Progress Notes (Signed)
   Subjective:    Patient ID: Anthony Hendricks, male    DOB: [birth year], 21 y.o.   MRN: [id number]  HPI  Mr. Anthony Hendricks is a 21 year old male with a history of facial eczema who presents today with a chief complaint of rash. His rash is chronically located to the face (each side of lateral mouth, nose, chin which has been present intermittently for years. He will mostly experience redness, dry skin, skin peeling, and discomfort to his face. He's been using low potency Triamcinolone with intermittent improvement.  He also reports a different type of rash to his chest with one spot on his chin that has been present for the past 1 year. He also reports very dry scalp with flaking of his scalp He denies itching. Two days ago he noticed a new spot on the chest. He's not applied anything OTC or Rx to his chest. He's been alternating between head and shoulders and other various shampoos with intermittent improvement in dry scalp.  Review of Systems  Constitutional: Negative for fever.  Musculoskeletal: Negative for arthralgias and joint swelling.  Skin: Positive for rash.       Dry, flaky scalp  Allergic/Immunologic: Negative for food allergies.       No past medical history on file.   Social History   Social History  . Marital status: Single    Spouse name: N/A  . Number of children: N/A  . Years of education: N/A   Occupational History  . Not on file.   Social History Main Topics  . Smoking status: Never Smoker  . Smokeless tobacco: Never Used  . Alcohol use 0.0 oz/week     Comment: social  . Drug use: No  . Sexual activity: Not on file   Other Topics Concern  . Not on file   Social History Narrative   Single.   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   Highest level of education 10th grade.   Enjoys playing basketball.    Past Surgical History:  Procedure Laterality Date  . TONSILLECTOMY AND ADENOIDECTOMY      Family History  Problem Relation Age of Onset  . Diabetes Mother   .  Diabetes Sister     No Known Allergies  Current Outpatient Prescriptions on File Prior to Visit  Medication Sig Dispense Refill  . triamcinolone cream (KENALOG) 0.1 % APPLY 1 APPLICATION TOPICALLY 2 (TWO) TIMES DAILY. 30 g 0   No current facility-administered medications on file prior to visit.     BP 120/76   Pulse 68   Temp 97.8 F (36.6 C) (Oral)   Ht  (1.803 m)   Wt 162 lb 6.4 oz (73.7 kg)   SpO2 98%   BMI 22.65 kg/m    Objective:   Physical Exam  Constitutional: He appears well-nourished.  HENT:  Head:    Mild erythema to both sides of nose and chin as shown in picture.  Neck: Neck supple.  Cardiovascular: Normal rate.   Pulmonary/Chest: Effort normal.    3 circular rashes with central clearing and mild scaling to anterior chest.   Skin: Skin is warm and dry.  Moderate dry skin to scalp with flaking.          Assessment & Plan:

## 2017-08-20 ENCOUNTER — Ambulatory Visit: Payer: BLUE CROSS/BLUE SHIELD | Admitting: Primary Care

## 2017-08-20 VITALS — BP 122/82 | HR 76 | Temp 98.2°F | Ht 71.0 in | Wt 163.0 lb

## 2017-08-20 DIAGNOSIS — L219 Seborrheic dermatitis, unspecified: Secondary | ICD-10-CM | POA: Diagnosis not present

## 2017-08-20 MED ORDER — DESONIDE 0.05 % EX CREA
TOPICAL_CREAM | Freq: Two times a day (BID) | CUTANEOUS | 0 refills | Status: DC
Start: 2017-08-20 — End: 2018-02-23

## 2017-08-20 NOTE — Assessment & Plan Note (Signed)
Today's rash more representative of seborrheic dermatitis. Consider underlying rosacea.  Stop triamcinolone. Rx for desonide 0.05% cream sent to pharmacy. Will also have him mix with clotrimazole cream from OTC stock. Apply BID x 2 weeks. He will update if no improvement.

## 2017-08-20 NOTE — Patient Instructions (Signed)
Stop triamcinolone 0.1% cream.  Start desonide 0.05%. Purchase clotrimazole antifungal cream.  Mix a small amount of each creams and apply to the face 1-2 times daily for 2 weeks.   Please call me if no improvement.   It was a pleasure to see you today!

## 2017-08-20 NOTE — Progress Notes (Signed)
   Subjective:    Patient ID: Anthony Hendricks, male    DOB: 1995/08/19, 22 y.o.   MRN: [id number]  HPI  Anthony Hendricks is a 22 year old male with a history of eczema and tinea corporis who presents today with a chief complaint of facial rash.   His rash is chronic to the bilateral cheeks on each side of the nose, bilateral sides of oral cavity. This is a chronic issue that will wax and wane. He's currently using triamcinolone 0.1% cream to his face which helps to decrease redness and burning, but without resolution.    He did notice a tremendous improvement to tinea capitus since using Selsun Blue shampoo. His tinea corporis resolved with the ciclopirox.    Review of Systems  Constitutional: Negative for fever.  Skin: Positive for rash.       No past medical history on file.   Social History   Socioeconomic History  . Marital status: Single    Spouse name: Not on file  . Number of children: Not on file  . Years of education: Not on file  . Highest education level: Not on file  Social Needs  . Financial resource strain: Not on file  . Food insecurity - worry: Not on file  . Food insecurity - inability: Not on file  . Transportation needs - medical: Not on file  . Transportation needs - non-medical: Not on file  Occupational History  . Not on file  Tobacco Use  . Smoking status: Never Smoker  . Smokeless tobacco: Never Used  Substance and Sexual Activity  . Alcohol use: Yes    Alcohol/week: 0.0 oz    Comment: social  . Drug use: No  . Sexual activity: Not on file  Other Topics Concern  . Not on file  Social History Narrative   Single.   Works in [employer].   Highest level of education 10th grade.   Enjoys playing basketball.    Past Surgical History:  Procedure Laterality Date  . TONSILLECTOMY AND ADENOIDECTOMY      Family History  Problem Relation Age of Onset  . Diabetes Mother   . Diabetes Sister     No Known Allergies  No current outpatient  medications on file prior to visit.   No current facility-administered medications on file prior to visit.     BP 122/82   Pulse 76   Temp 98.2 F (36.8 C) (Oral)   Ht 5\' 11"  (1.803 m)   Wt 163 lb (73.9 kg)   SpO2 99%   BMI 22.73 kg/m    Objective:   Physical Exam  Constitutional: He appears well-nourished.  HENT:  Head:    Neck: Neck supple.  Cardiovascular: Normal rate.  Pulmonary/Chest: Effort normal.  Skin: Skin is warm and dry.  Dry, scaly rash to bilateral cheeks at sides of nose, bilateral oral cavity, bilateral mandible. Small circular red spots around oral cavity. Mild erythema.           Assessment & Plan:

## 2017-09-09 ENCOUNTER — Other Ambulatory Visit: Payer: Self-pay

## 2017-09-09 ENCOUNTER — Encounter: Payer: Self-pay | Admitting: Emergency Medicine

## 2017-09-09 ENCOUNTER — Emergency Department
Admission: EM | Admit: 2017-09-09 | Discharge: 2017-09-09 | Disposition: A | Discharge status: Home / Self Care | Payer: BLUE CROSS/BLUE SHIELD | Attending: Emergency Medicine | Admitting: Emergency Medicine

## 2017-09-09 DIAGNOSIS — Y939 Activity, unspecified: Secondary | ICD-10-CM | POA: Diagnosis not present

## 2017-09-09 DIAGNOSIS — Y999 Unspecified external cause status: Secondary | ICD-10-CM | POA: Insufficient documentation

## 2017-09-09 DIAGNOSIS — S70312A Abrasion, left thigh, initial encounter: Secondary | ICD-10-CM | POA: Insufficient documentation

## 2017-09-09 DIAGNOSIS — S81832A Puncture wound without foreign body, left lower leg, initial encounter: Secondary | ICD-10-CM | POA: Insufficient documentation

## 2017-09-09 DIAGNOSIS — Y929 Unspecified place or not applicable: Secondary | ICD-10-CM | POA: Diagnosis not present

## 2017-09-09 DIAGNOSIS — S8992XA Unspecified injury of left lower leg, initial encounter: Secondary | ICD-10-CM | POA: Diagnosis present

## 2017-09-09 DIAGNOSIS — S81852A Open bite, left lower leg, initial encounter: Secondary | ICD-10-CM | POA: Diagnosis not present

## 2017-09-09 DIAGNOSIS — S71152A Open bite, left thigh, initial encounter: Secondary | ICD-10-CM | POA: Diagnosis not present

## 2017-09-09 DIAGNOSIS — W540XXA Bitten by dog, initial encounter: Secondary | ICD-10-CM | POA: Diagnosis not present

## 2017-09-09 DIAGNOSIS — Z23 Encounter for immunization: Secondary | ICD-10-CM | POA: Diagnosis not present

## 2017-09-09 MED ORDER — TETANUS-DIPHTH-ACELL PERTUSSIS 5-2.5-18.5 LF-MCG/0.5 IM SUSP
0.5000 mL | Freq: Once | INTRAMUSCULAR | Status: AC
  Administered 2017-09-09: 0.5 mL via INTRAMUSCULAR
  Filled 2017-09-09: qty 0.5

## 2017-09-09 MED ORDER — MELOXICAM 15 MG PO TABS: 15.0000 mg | ORAL_TABLET | Freq: Every day | ORAL | 2 refills | Status: DC

## 2017-09-09 MED ORDER — AMOXICILLIN-POT CLAVULANATE 875-125 MG PO TABS: 1.0000 | ORAL_TABLET | Freq: Two times a day (BID) | ORAL | 0 refills | Status: DC

## 2017-09-09 MED ORDER — LIDOCAINE HCL (PF) 1 % IJ SOLN: 5.0000 mL | Freq: Once | INTRAMUSCULAR | Status: DC

## 2017-09-09 MED ORDER — LIDOCAINE HCL (PF) 1 % IJ SOLN
INTRAMUSCULAR | Status: AC
  Filled 2017-09-09: qty 5

## 2017-09-09 MED ORDER — AMOXICILLIN-POT CLAVULANATE 875-125 MG PO TABS
1.0000 | ORAL_TABLET | Freq: Once | ORAL | Status: AC
  Administered 2017-09-09: 1 via ORAL
  Filled 2017-09-09: qty 1

## 2017-09-09 NOTE — Discharge Instructions (Addendum)
Follow-up with your regular doctor return the emergency department if you are worsening.  Take medication as prescribed.  Change the dressing daily.  He can use soap and water to clean the area.  If you develop fever please return the emergency department

## 2017-09-09 NOTE — ED Notes (Signed)
Pt Speaking to deputy Donavan FoilBass at this time

## 2017-09-09 NOTE — ED Provider Notes (Signed)
[facility] Emergency Department Provider Note  ____________________________________________   First MD Initiated Contact with Patient 09/09/17 2126     (approximate)  I have reviewed the triage vital signs and the nursing notes.   HISTORY  Chief Complaint Animal Bite    HPI Anthony Hendricks is a 22 y.o. male presents emergency department complaining of a dog bite to the left leg.  He states he was at home and saw a pit type dog running in his neighbor's yard.  He did not know that they were they are visiting.  He states when he went to knock on his neighbor's door that the dog jumped on him and bit his leg he states he was trying to fight the dog off.  [facility] department was notified.  Patient is unsure of his last tetanus shot  History reviewed. No pertinent past medical history.  Patient Active Problem List   Diagnosis Date Noted  . Seborrheic dermatitis 08/20/2017  . Tinea corporis 04/01/2017  . Difficulty concentrating 03/08/2016  . Polydipsia 03/08/2016  . Eczema 08/26/2015  . Muscle injury 08/01/2015    Past Surgical History:  Procedure Laterality Date  . TONSILLECTOMY AND ADENOIDECTOMY      Prior to Admission medications   Medication Sig Start Date End Date Taking? Authorizing Provider  amoxicillin-clavulanate (AUGMENTIN) 875-125 MG tablet Take 1 tablet by mouth 2 (two) times daily. 09/09/17   Marija Calamari, Roselyn Bering, PA-C  desonide (DESOWEN) 0.05 % cream Apply topically 2 (two) times daily. 08/20/17   Doreene Nest, NP  meloxicam (MOBIC) 15 MG tablet Take 1 tablet (15 mg total) by mouth daily. 09/09/17 09/09/18  Faythe Ghee, PA-C    Allergies Patient has no known allergies.  Family History  Problem Relation Age of Onset  . Diabetes Mother   . Diabetes Sister     Social History Social History   Tobacco Use  . Smoking status: Never Smoker  . Smokeless tobacco: Never Used  Substance Use Topics  . Alcohol use: Yes      Alcohol/week: 0.0 oz    Comment: social  . Drug use: No    Review of Systems  Constitutional: No fever/chills Eyes: No visual changes. ENT: No sore throat. Respiratory: Denies cough Genitourinary: Negative for dysuria. Musculoskeletal: Negative for back pain. Skin: Negative for rash.  Positive for dog bite to the left leg    ____________________________________________   PHYSICAL EXAM:  VITAL SIGNS: ED Triage Vitals  Enc Vitals Group     BP 09/09/17 2102 (!) 163/100     Pulse Rate 09/09/17 2102 (!) 110     Resp 09/09/17 2102 18     Temp 09/09/17 2102 98 F (36.7 C)     Temp Source 09/09/17 2102 Oral     SpO2 09/09/17 2102 99 %     Weight 09/09/17 2104 165 lb (74.8 kg)     Height 09/09/17 2104  (1.803 m)     Head Circumference --      Peak Flow --      Pain Score 09/09/17 2104 8     Pain Loc --      Pain Edu? --      Excl. in GC? --     Constitutional: Alert and oriented. Well appearing and in no acute distress. Eyes: Conjunctivae are normal.  Head: Atraumatic. Nose: No congestion/rhinnorhea. Mouth/Throat: Mucous membranes are moist.   Cardiovascular: Normal rate, regular rhythm. Respiratory: Normal respiratory effort.  No retractions  GU: deferred Musculoskeletal: FROM all extremities, warm and well perfused Neurologic:  Normal speech and language.  Skin:  Skin is warm, dry, positive for dog bite type puncture wounds on the left calf left thigh, 2 views the puncture wounds are very deep.  No foreign body is noted.  The area was probed and evaluated deeply Psychiatric: Mood and affect are normal. Speech and behavior are normal.  ____________________________________________   LABS (all labs ordered are listed, but only abnormal results are displayed)  Labs Reviewed - No data to  display ____________________________________________   ____________________________________________  RADIOLOGY    ____________________________________________   PROCEDURES  Procedure(s) performed:   1% Xylocaine was used as a local anesthetic, the area was cleaned with deep pressure washed with Betadine and saline.  The puncture wounds appear to be very deep therefore they were not closed.  A dressing was applied by the nurse  Procedures    ____________________________________________   INITIAL IMPRESSION / ASSESSMENT AND PLAN / ED COURSE  Pertinent labs & imaging results that were available during my care of the patient were reviewed by me and considered in my medical decision making (see chart for details).  Patient is 22 year old male presents emergency department complaining of a dog bite to the left thigh.    On physical exam there are 2 superficial abrasions at the top of the thigh.  One deep above the knee.  One abrasion above the knee.  One very deep puncture wound on the calf.  The area was cleaned with Betadine and saline.  1% Xylocaine for local.  I used deep pressure washing with Betadine and saline.  Patient tolerated procedure well.  Patient was given a Tdap and Augmentin 875 mg p.o. while in the ER.  He was given a prescription for Augmentin 875 mg twice daily for 7 days.  Explained to him why we are not closing the area.  Explained to him that a deep puncture wound will become severely infected if I close the top layer.  No foreign body was appreciated.  The patient was instructed to wash the area with soap and water daily.  He is to keep a bandage over the area while at work.  He states he understands.  He will return if worsening.  He was instructed to return if he develops a fever or more redness pus or drainage.  He was discharged in stable condition     As part of my medical decision making, I reviewed the following data within the electronic medical  record:  Nursing notes reviewed and incorporated, Notes from prior ED visits and [place] Controlled Substance Database  ____________________________________________   FINAL CLINICAL IMPRESSION(S) / ED DIAGNOSES  Final diagnoses:  Dog bite, initial encounter      NEW MEDICATIONS STARTED DURING THIS VISIT:  New Prescriptions   AMOXICILLIN-CLAVULANATE (AUGMENTIN) 875-125 MG TABLET    Take 1 tablet by mouth 2 (two) times daily.   MELOXICAM (MOBIC) 15 MG TABLET    Take 1 tablet (15 mg total) by mouth daily.     Note:  This document was prepared using Dragon voice recognition software and may include unintentional dictation errors.    Faythe Ghee, PA-C 09/09/17 2210    Phineas Semen, MD 09/09/17 2241

## 2017-09-09 NOTE — ED Triage Notes (Addendum)
Pt to triage via w/c with no distress noted; pt reports being bit by dog to left lower leg PTA; superficial injury noted to right FA; small puncture noted to thigh x 2; no bleeding noted at present; injury occurred on ([address], ,[place] incident reported to [employer] [facility] (reports deputy will contact); saline soaked dressing applied

## 2017-09-11 ENCOUNTER — Ambulatory Visit: Payer: Self-pay | Admitting: *Deleted

## 2017-09-11 NOTE — Telephone Encounter (Signed)
Noted  

## 2017-09-11 NOTE — Telephone Encounter (Signed)
Pt triaged per Rena at [facility]; pt was bit by a dog on 09/09/17 and was evaluated in the ED; he states that the ED person said it was a deep gash and she wanted to stitch it was too deep; the pt states that it is still painful, and the bottom area is bleeding; he states he has no fever, pus; but he has fatty looking tissue visualized; nurse triage initiated and recommendations per protocol to include seeing a physician within 24 hours; pt previously made appointment, per [place], 09/12/17 at 1530 for hospital follow up visit; will route to office for notification of this encounter.      Reason for Disposition . [1] Taking antibiotic > 72 hours (3 days) for infected bite AND [2] has not improved (i.e., pain, pus, redness)  Answer Assessment - Initial Assessment Questions 1. ANIMAL: "What type of animal caused the bite?" "Is the injury from a bite or a claw?" If the animal is a dog or a cat, ask: "Was it a pet or a stray?" "Was it acting ill or behaving strangely?"     09/09/17 dog bite; neighbor's dog  2. LOCATION: "Where is the bite located?"      Left leg 3. SIZE: "How big is the bite?" "What does it look like?"      Multiple puncture wounds 4. ONSET: "When did the bite happen?" (Minutes or hours ago)      09/09/17; seen in ED 5. CIRCUMSTANCES: "Tell me how this happened."      Dog in yard 6. TETANUS: "When was the last tetanus booster?"     09/09/17 in ED 7. PREGNANCY: "Is there any chance you are pregnant?" "When was your last menstrual period?"     n/a  Protocols used: ANIMAL BITE-A-AH

## 2017-09-12 ENCOUNTER — Ambulatory Visit: Payer: BLUE CROSS/BLUE SHIELD | Admitting: Primary Care

## 2017-09-12 ENCOUNTER — Encounter: Payer: Self-pay | Admitting: Primary Care

## 2017-09-12 VITALS — BP 114/70 | HR 95 | Temp 97.9°F | Ht 71.0 in | Wt 164.0 lb

## 2017-09-12 DIAGNOSIS — S51851A Open bite of right forearm, initial encounter: Secondary | ICD-10-CM | POA: Diagnosis not present

## 2017-09-12 DIAGNOSIS — W540XXD Bitten by dog, subsequent encounter: Secondary | ICD-10-CM | POA: Diagnosis not present

## 2017-09-12 DIAGNOSIS — S81852A Open bite, left lower leg, initial encounter: Secondary | ICD-10-CM

## 2017-09-12 DIAGNOSIS — S4992XA Unspecified injury of left shoulder and upper arm, initial encounter: Secondary | ICD-10-CM

## 2017-09-12 NOTE — Patient Instructions (Signed)
Continue taking Augmentin antibiotics as prescribed.  Keep wounds clean and dry.   It was a pleasure to see you today!

## 2017-09-12 NOTE — Progress Notes (Signed)
Subjective:    Patient ID: Anthony Hendricks, male    DOB: [birth year], 22 y.o.   MRN: [id number]  HPI  Anthony Hendricks is a 22 year old male who presents today for emergency department follow up.  He presented to [facility] ED on 09/09/17 with a chief complaint of dog bite. He saw a pit bull dog in his yard running around and teasing his dog.  He approached his neighbors house to notify them of the pit bull in the yard when the dog jumped on him. He started to fight off the dog and sustained puncture wounds to his left lower extremity, right forearm, left upper arm.   During his stay in the ED he was provided with a tetanus vaccination. His wounds were cleansed, no sutures required. He was provided with a prescription for a seven day course of Augmentin and discharged home.   He called into the nurse triage system yesterday with reports of painful and bleeding wounds. He's slowly bleeding through is bandages throughout his work day. He was sent home yesterday. He's been compliant to his Augmentin.    Review of Systems  Constitutional: Negative for fever.  Skin: Positive for wound.       No past medical history on file.   Social History   Socioeconomic History  . Marital status: Single    Spouse name: Not on file  . Number of children: Not on file  . Years of education: Not on file  . Highest education level: Not on file  Occupational History  . Not on file  Social Needs  . Financial resource strain: Not on file  . Food insecurity:    Worry: Not on file    Inability: Not on file  . Transportation needs:    Medical: Not on file    Non-medical: Not on file  Tobacco Use  . Smoking status: Never Smoker  . Smokeless tobacco: Never Used  Substance and Sexual Activity  . Alcohol use: Yes    Alcohol/week: 0.0 oz    Comment: social  . Drug use: No  . Sexual activity: Not on file  Lifestyle  . Physical activity:    Days per week: Not on file    Minutes per session: Not on file  . Stress:  Not on file  Relationships  . Social connections:    Talks on phone: Not on file    Gets together: Not on file    Attends religious service: Not on file    Active member of club or organization: Not on file    Attends meetings of clubs or organizations: Not on file    Relationship status: Not on file  . Intimate partner violence:    Fear of current or ex partner: Not on file    Emotionally abused: Not on file    Physically abused: Not on file    Forced sexual activity: Not on file  Other Topics Concern  . Not on file  Social History Narrative   Single.   Works in [employer].   Highest level of education 10th grade.   Enjoys playing basketball.    Past Surgical History:  Procedure Laterality Date  . TONSILLECTOMY AND ADENOIDECTOMY      Family History  Problem Relation Age of Onset  . Diabetes Mother   . Diabetes Sister     No Known Allergies  Current Outpatient Medications on File Prior to Visit  Medication Sig Dispense Refill  . amoxicillin-clavulanate (AUGMENTIN)  875-125 MG tablet Take 1 tablet by mouth 2 (two) times daily. 20 tablet 0  . desonide (DESOWEN) 0.05 % cream Apply topically 2 (two) times daily. 30 g 0  . meloxicam (MOBIC) 15 MG tablet Take 1 tablet (15 mg total) by mouth daily. 30 tablet 2   No current facility-administered medications on file prior to visit.     BP 114/70 (BP Location: Left Arm, Patient Position: Sitting, Cuff Size: Normal)   Pulse 95   Temp 97.9 F (36.6 C) (Oral)   Ht 5\' 11"  (1.803 m)   Wt 164 lb (74.4 kg)   SpO2 98%   BMI 22.87 kg/m    Objective:   Physical Exam  Constitutional: He appears well-nourished.  Skin: Skin is warm and dry.     Numerous puncture wounds without drainage, increased erythema.          Assessment & Plan:  Dog Bite:  Sustained on 09/09/17, neighbor's daughter's dog. Exam today with non infectious appearing puncture wounds, evidence of dried blood to sites, no active  bleeding. Wounds cleansed, re-bandaged, re-wrapped. Discussed home care first aid and dressing changes.  Continue Augmentin as prescribed.  Doreene Nest, NP

## 2017-09-15 ENCOUNTER — Ambulatory Visit: Payer: Self-pay | Admitting: *Deleted

## 2017-09-15 NOTE — Telephone Encounter (Signed)
Pt was seen 09/12/17.Please advise.  

## 2017-09-15 NOTE — Telephone Encounter (Signed)
Recommend tylenol or Ibuprofen as needed. Also recommend he purchase a donut pillow from the store to help relieve pressure from his tailbone when sitting. If symptoms persist longer than one week then recommend he return for xray.

## 2017-09-15 NOTE — Telephone Encounter (Signed)
Spoken and notified patient of Kate's comments. Patient verbalized understanding. 

## 2017-09-15 NOTE — Telephone Encounter (Signed)
  Pt called with c/o feeling pressure in his tailbone or buttocks. He states there is no pain until he tries to get up from a sitting position or bending down to pick something up.  He was knocked on the ground from when he fought off a pit bull last week.  He does have scratches to his buttocks. He has tingling to his left thigh, in the leg that the dog had bitten. And  that has has gone on since the bite. He has mobic for pain and also will take one Tylenol for discomfort. He denies fever.  He also had used ice on it and that was uncomfortable to him. He is requesting a call back from his pcp. Home care advice given to him with verbal understanding.  Reason for Disposition . Minor tailbone injury  Answer Assessment - Initial Assessment Questions 1. MECHANISM: "How did the injury happen?"       Fighting off a pit dog 2. ONSET: "When did the injury happen?" (Minutes or hours ago)      Last week 3. LOCATION: "Where is the injury located?"      Pressure on his tailbone and pain when bending down 4. SEVERITY: "Can you sit?" "Can you walk?"      yes 5. PAIN: "Is there pain?" If so, ask: "How bad is the pain?"    (Scale 1-10; or mild, moderate, severe)     Yes when bending down, bad 6. SIZE: For bruises, or swelling, ask: "How large is it?" (e.g., inches or centimeters)      Few bruises and scratches 7. OTHER SYMPTOMS: "Do you have any other symptoms?" (e.g., numbness, back pain)     no 8. PREGNANCY: "Is there any chance you are pregnant?" "When was your last menstrual period?"     no  Protocols used: TAILBONE INJURY-A-AH

## 2017-09-17 ENCOUNTER — Encounter: Payer: Self-pay | Admitting: Family Medicine

## 2017-09-17 ENCOUNTER — Ambulatory Visit (INDEPENDENT_AMBULATORY_CARE_PROVIDER_SITE_OTHER)
Admission: RE | Admit: 2017-09-17 | Discharge: 2017-09-17 | Disposition: A | Discharge status: Home / Self Care | Payer: BLUE CROSS/BLUE SHIELD | Source: Ambulatory Visit | Attending: Family Medicine | Admitting: Family Medicine

## 2017-09-17 ENCOUNTER — Ambulatory Visit
Admission: RE | Admit: 2017-09-17 | Discharge: 2017-09-17 | Disposition: A | Discharge status: Home / Self Care | Payer: BLUE CROSS/BLUE SHIELD | Source: Ambulatory Visit | Attending: Family Medicine | Admitting: Family Medicine

## 2017-09-17 ENCOUNTER — Ambulatory Visit: Payer: BLUE CROSS/BLUE SHIELD | Admitting: Family Medicine

## 2017-09-17 VITALS — BP 124/80 | HR 78 | Temp 98.4°F | Wt 165.0 lb

## 2017-09-17 DIAGNOSIS — M545 Low back pain, unspecified: Secondary | ICD-10-CM

## 2017-09-17 DIAGNOSIS — W19XXXD Unspecified fall, subsequent encounter: Secondary | ICD-10-CM | POA: Diagnosis not present

## 2017-09-17 DIAGNOSIS — S3992XA Unspecified injury of lower back, initial encounter: Secondary | ICD-10-CM | POA: Insufficient documentation

## 2017-09-17 DIAGNOSIS — W540XXD Bitten by dog, subsequent encounter: Secondary | ICD-10-CM | POA: Diagnosis not present

## 2017-09-17 DIAGNOSIS — M549 Dorsalgia, unspecified: Secondary | ICD-10-CM | POA: Diagnosis not present

## 2017-09-17 NOTE — Assessment & Plan Note (Addendum)
Acute worsening of lower back pain after attempting regular work activity involving heavy lifting. Exam today overall benign but he does have reproducible pain left sacral lower back. Anticipate buttock strain vs sacral contusion after repeated falls. Supportive care reviewed. rec continued meloxicam, add heating pad. Given trauma, will check coccyx films r/o fracture or other cause. Recommend light duty for 1 week at work to facilitate recovery, pt hesitantly agrees.

## 2017-09-17 NOTE — Patient Instructions (Addendum)
I think you have buttock strain after recent repeated falls Back xrays today.  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  Continue anti inflammatory. Don't sit on hard surfaces for now.  Ice or heating pad to lower back.

## 2017-09-17 NOTE — Progress Notes (Signed)
BP 124/80 (BP Location: Left Arm, Patient Position: Sitting, Cuff Size: Normal)   Pulse 78   Temp 98.4 F (36.9 C) (Oral)   Wt 165 lb (74.8 kg)   SpO2 99%   BMI 23.01 kg/m    CC: coccyx pain Subjective:    Patient ID: Anthony Hendricks, male    DOB: [birth year], 22 y.o.   MRN: [id number]  HPI: Anthony Hendricks is a 22 y.o. male presenting on 09/17/2017 for Tailbone Pain (Occurs when bending forward, squatting or trying to lift objects. Pain described as very sharp. Pt fell several times on rocks and other objects on 09/09/17 while being attacked by a dog.  [facility]. Taking Tylenol and Meloxicam.  Has also tried ice pack. )   DOI: Attacked by dog 09/09/2017. Several falls onto lower back fending off dog in neighbor's yard. Seen at ER with Tdap, augmentin course, anti inflammatories. Records reviewed. Puncture wounds of arms and leg seem to be healing well. Continues antibiotic and meloxicam 15mg  daily.   Tried to return to work on Monday - with sudden worsening pain at tailbone with heavy lifting. Worse pain noted when bending forward and especially with any lifting > 15 lbs - describes sharp stabbing pain with this.  Denies fevers/chills, shooting pain down legs, saddle anesthesia, bowel/bladder incontinence. Some paresthesias of left leg since falls.   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   Relevant past medical, surgical, family and social history reviewed and updated as indicated. Interim medical history since our last visit reviewed. Allergies and medications reviewed and updated. Outpatient Medications Prior to Visit  Medication Sig Dispense Refill  . amoxicillin-clavulanate (AUGMENTIN) 875-125 MG tablet Take 1 tablet by mouth 2 (two) times daily. 20 tablet 0  . desonide (DESOWEN) 0.05 % cream Apply topically 2 (two) times daily. 30 g 0  . meloxicam (MOBIC) 15 MG tablet Take 1 tablet (15 mg total) by mouth daily. 30 tablet 2   No  facility-administered medications prior to visit.      Per HPI unless specifically indicated in ROS section below Review of Systems     Objective:    BP 124/80 (BP Location: Left Arm, Patient Position: Sitting, Cuff Size: Normal)   Pulse 78   Temp 98.4 F (36.9 C) (Oral)   Wt 165 lb (74.8 kg)   SpO2 99%   BMI 23.01 kg/m   Wt Readings from Last 3 Encounters:  09/17/17 165 lb (74.8 kg)  09/12/17 164 lb (74.4 kg)  09/09/17 165 lb (74.8 kg)    Physical Exam  Constitutional: He appears well-developed and well-nourished. No distress.  Musculoskeletal: He exhibits no edema.  No significant reproducible pain midline spine No paraspinous mm tenderness Neg SLR bilaterally. No pain with int/ext rotation at hip. Neg FABER. No pain at SIJ, GTB or sciatic notch bilaterally.  Reproducible pain to palpation just lateral to lower lumbar/sacral spine on left  Neurological: He is alert. He has normal strength. No sensory deficit. Coordination and gait normal.  5/5 strength BLE Sensation seems intact  Skin: Skin is warm and dry. No rash noted. No erythema.  Nursing note and vitals reviewed.      Assessment & Plan:   Problem List Items Addressed This Visit    Acute left-sided low back pain without sciatica - Primary    Acute worsening of lower back pain after attempting regular work activity involving heavy lifting. Exam today overall benign but he  does have reproducible pain left sacral lower back. Anticipate buttock strain vs sacral contusion after repeated falls. Supportive care reviewed. rec continued meloxicam, add heating pad. Given trauma, will check coccyx films r/o fracture or other cause. Recommend light duty for 1 week at work to facilitate recovery, pt hesitantly agrees.       Relevant Orders   DG Sacrum/Coccyx    Other Visit Diagnoses    Fall, subsequent encounter       Dog bite, subsequent encounter           No orders of the defined types were placed in this  encounter.  Orders Placed This Encounter  Procedures  . DG Sacrum/Coccyx    Standing Status:   Future    Number of Occurrences:   1    Standing Expiration Date:   11/18/2018    Order Specific Question:   Reason for Exam (SYMPTOM  OR DIAGNOSIS REQUIRED)    Answer:   L lower back pain after trauma 1 week ago    Order Specific Question:   Preferred imaging location?    Answer:   [facility]    Order Specific Question:   Radiology Contrast Protocol - do NOT remove file path    Answer:   [email or web address]    Follow up plan: Return if symptoms worsen or fail to improve.  Eustaquio Boyden, MD

## 2017-09-18 ENCOUNTER — Telehealth: Payer: Self-pay

## 2017-09-18 ENCOUNTER — Other Ambulatory Visit: Payer: Self-pay | Admitting: Family Medicine

## 2017-09-18 DIAGNOSIS — M533 Sacrococcygeal disorders, not elsewhere classified: Secondary | ICD-10-CM

## 2017-09-18 NOTE — Telephone Encounter (Signed)
See xray note

## 2017-09-18 NOTE — Telephone Encounter (Signed)
Copied from CRM [phone]. Topic: Quick Communication - Other Results >> Sep 18, 2017  2:35 PM Crist InfanteHarrald, Kathy J wrote: Pt following up on xray results from yesterday.  Pt wants to know if he should continue to work

## 2017-09-20 DIAGNOSIS — M545 Low back pain: Secondary | ICD-10-CM | POA: Diagnosis not present

## 2017-10-13 ENCOUNTER — Ambulatory Visit: Payer: BLUE CROSS/BLUE SHIELD | Admitting: Family Medicine

## 2017-10-14 ENCOUNTER — Encounter: Payer: Self-pay | Admitting: Family Medicine

## 2017-10-14 ENCOUNTER — Ambulatory Visit: Payer: BLUE CROSS/BLUE SHIELD | Admitting: Family Medicine

## 2017-10-14 VITALS — BP 122/64 | HR 95 | Temp 98.2°F | Ht 71.0 in | Wt 163.5 lb

## 2017-10-14 DIAGNOSIS — S3992XD Unspecified injury of lower back, subsequent encounter: Secondary | ICD-10-CM | POA: Diagnosis not present

## 2017-10-14 DIAGNOSIS — M545 Low back pain, unspecified: Secondary | ICD-10-CM | POA: Insufficient documentation

## 2017-10-14 DIAGNOSIS — L219 Seborrheic dermatitis, unspecified: Secondary | ICD-10-CM | POA: Diagnosis not present

## 2017-10-14 NOTE — Progress Notes (Signed)
BP 122/64 (BP Location: Left Arm, Patient Position: Sitting, Cuff Size: Normal)   Pulse 95   Temp 98.2 F (36.8 C) (Oral)   Ht  (1.803 m)   Wt 163 lb 8 oz (74.2 kg)   SpO2 99%   BMI 22.80 kg/m    CC: f/u visit Subjective:    Patient ID: Anthony Hendricks, male    DOB: 05/14/1996, 22 y.o.   MRN: [id number]  HPI: Anthony Hendricks is a 22 y.o. male presenting on 10/14/2017 for Follow-up (Here for ortho f/u. Still of having ongoing pain. ) and Eczema (Wants to discuss cream rx)   See prior note for details. Falls during dog attack suffered in neighbor's yard led to presumed traumatic distal coccygeal angulation. Referred to Dr Yevette Edwards 09/20/2017. He was seen once by PA, treated with muscle relaxant, and advised f/u if ongoing pain. Records requested today (we never received). Declines return to ortho due to cost.  DOI: 09/09/2017.   Notes midline upper lumbar back pain worse over the last few weeks. Muscle relaxants did help (robaxin). He is not taking meloxicam - this did help. He is also stretching every morning. He feels coccyx pain is improving.   Also - ongoing facial rash treated as seborrheic dermatitis. Combining clotrimazole with desonide has been helpful.   Relevant past medical, surgical, family and social history reviewed and updated as indicated. Interim medical history since our last visit reviewed. Allergies and medications reviewed and updated. Outpatient Medications Prior to Visit  Medication Sig Dispense Refill  . cyclobenzaprine (FLEXERIL) 5 MG tablet TAKE 1-2 TABLET(S) BY MOUTH (NIGHT) AS NEEDED FOR MUSCLE SPASM/TIGHTNESS  1  . desonide (DESOWEN) 0.05 % cream Apply topically 2 (two) times daily. 30 g 0  . methocarbamol (ROBAXIN) 500 MG tablet TAKE 1 TABLET BY MOUTH TWICE A DAY AS NEEDED BACK SPASM/TIGHTNESS (DAYTIME)  0  . meloxicam (MOBIC) 15 MG tablet Take 1 tablet (15 mg total) by mouth daily. 30 tablet 2  . amoxicillin-clavulanate (AUGMENTIN) 875-125 MG tablet Take  1 tablet by mouth 2 (two) times daily. 20 tablet 0   No facility-administered medications prior to visit.      Per HPI unless specifically indicated in ROS section below Review of Systems     Objective:    BP 122/64 (BP Location: Left Arm, Patient Position: Sitting, Cuff Size: Normal)   Pulse 95   Temp 98.2 F (36.8 C) (Oral)   Ht  (1.803 m)   Wt 163 lb 8 oz (74.2 kg)   SpO2 99%   BMI 22.80 kg/m   Wt Readings from Last 3 Encounters:  10/14/17 163 lb 8 oz (74.2 kg)  09/17/17 165 lb (74.8 kg)  09/12/17 164 lb (74.4 kg)    Physical Exam  Constitutional: He appears well-developed and well-nourished. No distress.  Musculoskeletal: Normal range of motion. He exhibits no edema.  No pain midline spine No paraspinous mm tenderness Neg SLR bilaterally. No pain with int/ext rotation at hip. Neg FABER. No pain at SIJ, GTB or sciatic notch bilaterally.   Skin: Skin is warm and dry. No rash noted.  Nursing note and vitals reviewed.     Assessment & Plan:  Ortho records have been requested.  Problem List Items Addressed This Visit    Injury of coccyx - Primary    Saw ortho PA at the beginning of this month, did not see Dumonski - slowly improving. Reviewed anticipated ongoing course of resolution. Suggested f/u with sports med if  not improving with treatment.       Lumbar spine pain    Endorses prior lumbar pain, acutely worse after dog attack. Benign exam today. Discussed anticipated lumbar strain, provided with exercises from [facility] pt advisor. Discussed further conservative care (heating pad, NSAIDs, muscle relaxants). Pt declines further medication at this time.  He states he may have had old lumbar films at [facility] ortho - have requested also today      Relevant Medications   cyclobenzaprine (FLEXERIL) 5 MG tablet   methocarbamol (ROBAXIN) 500 MG tablet   Seborrheic dermatitis    This has significantly improved with desonide/clotrimazole mix. Discussed need to limit  desonide use on face, may continue clotrimazole daily. If ineffective, consider ketoconazole 2% cream or topical calcineurin inhibitor (likely more expensive).           No orders of the defined types were placed in this encounter.  No orders of the defined types were placed in this encounter.   Follow up plan: No follow-ups on file.  Anthony Boyden, MD

## 2017-10-14 NOTE — Patient Instructions (Addendum)
Do exercises provided today.  This should continue to heal over next several weeks.  If coccyx pain persists past 1 month, return to see Dr Patsy Lager.  Desonide cream will be max 1-2 weeks at a time then you need to give skin a break. Continue daily antifungal for maintenance - if not effective let us know for different antifungal cream trial.

## 2017-10-14 NOTE — Assessment & Plan Note (Signed)
Saw ortho PA at the beginning of this month, did not see Dumonski - slowly improving. Reviewed anticipated ongoing course of resolution. Suggested f/u with sports med if not improving with treatment.

## 2017-10-14 NOTE — Assessment & Plan Note (Signed)
This has significantly improved with desonide/clotrimazole mix. Discussed need to limit desonide use on face, may continue clotrimazole daily. If ineffective, consider ketoconazole 2% cream or topical calcineurin inhibitor (likely more expensive).

## 2017-10-14 NOTE — Assessment & Plan Note (Addendum)
Endorses prior lumbar pain, acutely worse after dog attack. Benign exam today. Discussed anticipated lumbar strain, provided with exercises from [facility] pt advisor. Discussed further conservative care (heating pad, NSAIDs, muscle relaxants). Pt declines further medication at this time.  He states he may have had old lumbar films at [facility] ortho - have requested also today

## 2017-11-26 ENCOUNTER — Telehealth: Payer: Self-pay | Admitting: Primary Care

## 2017-11-26 DIAGNOSIS — L309 Dermatitis, unspecified: Secondary | ICD-10-CM

## 2017-11-26 DIAGNOSIS — L219 Seborrheic dermatitis, unspecified: Secondary | ICD-10-CM

## 2017-11-26 NOTE — Telephone Encounter (Signed)
Reasonable. Will forward to PCP

## 2017-11-26 NOTE — Telephone Encounter (Signed)
Pt last saw Dr Reece AgarG on 10/14/17.Please advise.

## 2017-11-26 NOTE — Telephone Encounter (Signed)
Referral placed.

## 2017-11-26 NOTE — Addendum Note (Signed)
Addended by: Doreene NestLARK, Etrulia Zarr K on: 11/26/2017 05:04 PM   Modules accepted: Orders

## 2017-11-26 NOTE — Telephone Encounter (Signed)
Copied from CRM [phone]. Topic: Referral - Request >> Nov 26, 2017  4:05 PM Floria RavelingStovall, Shana A wrote: Reason for CRM:  pt called in and would now like the referral to Dermatology .  He stated the rash on his face is not getting any better.

## 2017-11-27 ENCOUNTER — Encounter: Payer: Self-pay | Admitting: Primary Care

## 2017-11-27 ENCOUNTER — Ambulatory Visit: Payer: BLUE CROSS/BLUE SHIELD | Admitting: Primary Care

## 2017-11-27 DIAGNOSIS — L219 Seborrheic dermatitis, unspecified: Secondary | ICD-10-CM

## 2017-11-27 NOTE — Progress Notes (Signed)
Subjective:    Patient ID: Anthony Hendricks, male    DOB: [birth year], 22 y.o.   MRN: [id number]  HPI  Anthony Hendricks is a 22 year old male who presents today with a chief complaint of facial rash.  He has a history of this facial rash for years and has been treated several times in our office with temporary improvement. During his most recent visit he was treated with desonide 0.05% cream with the longest duration of improvement, but then rash returned.  He was also treated triamcinolone 0.1% cream, ciclopriox 0.77%, Selsun Blue, Head and Shoulders with short term improvement or no improvement.  He was referred to dermatology and is here today to ensure this is the correct move.   Review of Systems  Constitutional: Negative for fever.  Skin: Positive for rash.       No past medical history on file.   Social History   Socioeconomic History  . Marital status: Single    Spouse name: Not on file  . Number of children: Not on file  . Years of education: Not on file  . Highest education level: Not on file  Occupational History  . Not on file  Social Needs  . Financial resource strain: Not on file  . Food insecurity:    Worry: Not on file    Inability: Not on file  . Transportation needs:    Medical: Not on file    Non-medical: Not on file  Tobacco Use  . Smoking status: Never Smoker  . Smokeless tobacco: Never Used  Substance and Sexual Activity  . Alcohol use: Yes    Alcohol/week: 0.0 oz    Comment: social  . Drug use: No  . Sexual activity: Not on file  Lifestyle  . Physical activity:    Days per week: Not on file    Minutes per session: Not on file  . Stress: Not on file  Relationships  . Social connections:    Talks on phone: Not on file    Gets together: Not on file    Attends religious service: Not on file    Active member of club or organization: Not on file    Attends meetings of clubs or organizations: Not on file    Relationship status: Not on file  .  Intimate partner violence:    Fear of current or ex partner: Not on file    Emotionally abused: Not on file    Physically abused: Not on file    Forced sexual activity: Not on file  Other Topics Concern  . Not on file  Social History Narrative   Single.   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   Highest level of education 10th grade.   Enjoys playing basketball.    Past Surgical History:  Procedure Laterality Date  . TONSILLECTOMY AND ADENOIDECTOMY      Family History  Problem Relation Age of Onset  . Diabetes Mother   . Diabetes Sister     No Known Allergies  Current Outpatient Medications on File Prior to Visit  Medication Sig Dispense Refill  . desonide (DESOWEN) 0.05 % cream Apply topically 2 (two) times daily. (Patient not taking: Reported on 11/27/2017) 30 g 0   No current facility-administered medications on file prior to visit.     BP 118/66   Pulse 72   Temp 97.9 F (36.6 C) (Oral)   Ht 5\' 11"  (1.803 m)   Wt 165 lb 4 oz (75 kg)  SpO2 98%   BMI 23.05 kg/m    Objective:   Physical Exam  Constitutional: He appears well-nourished.  HENT:  Head:    Skin: Skin is warm and dry.  Flaky, flesh colored rash to bilateral lower face. Circular mildly erythematosus spots to submandibular region of neck.            Assessment & Plan:

## 2017-11-27 NOTE — Assessment & Plan Note (Signed)
Did have the most improvement with desonide, but rash and dryness have returned. Recommended to proceed with dermatology referral for further evaluation. He will proceed.

## 2017-11-27 NOTE — Patient Instructions (Signed)
You have tried Triamcinolone 0.1% cream, ciclopriox 0.77% cream, and desonide 0.05% cream. Desonide is the most recent cream you tried.  You've also tried [employer]Selsun Blue and Head and Shoulders shampoo without improvement.  Stop by the front desk and speak with either Shirlee LimerickMarion or Alvina ChouAnastasiya regarding your referral to Dermatology, they will get you set up.  It was a pleasure to see you today!

## 2017-12-09 DIAGNOSIS — L218 Other seborrheic dermatitis: Secondary | ICD-10-CM | POA: Diagnosis not present

## 2018-02-23 ENCOUNTER — Encounter: Payer: Self-pay | Admitting: Internal Medicine

## 2018-02-23 ENCOUNTER — Telehealth: Payer: Self-pay | Admitting: Primary Care

## 2018-02-23 ENCOUNTER — Ambulatory Visit: Payer: BLUE CROSS/BLUE SHIELD | Admitting: Internal Medicine

## 2018-02-23 VITALS — BP 124/86 | HR 95 | Temp 98.2°F | Wt 167.0 lb

## 2018-02-23 DIAGNOSIS — H5789 Other specified disorders of eye and adnexa: Secondary | ICD-10-CM

## 2018-02-23 MED ORDER — AZITHROMYCIN 1 % OP SOLN: 1.0000 [drp] | Freq: Every day | OPHTHALMIC | 0 refills | Status: DC

## 2018-02-23 MED ORDER — POLYMYXIN B-TRIMETHOPRIM 10000-0.1 UNIT/ML-% OP SOLN: 1.0000 [drp] | OPHTHALMIC | 0 refills | Status: DC

## 2018-02-23 NOTE — Progress Notes (Signed)
Subjective:    Patient ID: Anthony Hendricks, male    DOB: Apr 10, 1996, 22 y.o.   MRN: [id number]  HPI  Pt presents to the clinic today with c/o watering of his left eye. He noticed this 2-3 days ago. He also feels like something is in his eye. He has intermittent blurry vision. He has noticed he is more sensitive to the sun. He denies an injury to the area but reports he was driving down the road and all of a sudden his left eye teared up. He washed his eye out with Visine and felt like there has been some improvement.  Review of Systems      No past medical history on file.  No current outpatient medications on file.   No current facility-administered medications for this visit.     No Known Allergies  Family History  Problem Relation Age of Onset  . Diabetes Mother   . Diabetes Sister     Social History   Socioeconomic History  . Marital status: Single    Spouse name: Not on file  . Number of children: Not on file  . Years of education: Not on file  . Highest education level: Not on file  Occupational History  . Not on file  Social Needs  . Financial resource strain: Not on file  . Food insecurity:    Worry: Not on file    Inability: Not on file  . Transportation needs:    Medical: Not on file    Non-medical: Not on file  Tobacco Use  . Smoking status: Never Smoker  . Smokeless tobacco: Never Used  Substance and Sexual Activity  . Alcohol use: Yes    Alcohol/week: 0.0 standard drinks    Comment: social  . Drug use: No  . Sexual activity: Not on file  Lifestyle  . Physical activity:    Days per week: Not on file    Minutes per session: Not on file  . Stress: Not on file  Relationships  . Social connections:    Talks on phone: Not on file    Gets together: Not on file    Attends religious service: Not on file    Active member of club or organization: Not on file    Attends meetings of clubs or organizations: Not on file    Relationship status: Not on  file  . Intimate partner violence:    Fear of current or ex partner: Not on file    Emotionally abused: Not on file    Physically abused: Not on file    Forced sexual activity: Not on file  Other Topics Concern  . Not on file  Social History Narrative   Single.   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   Highest level of education 10th grade.   Enjoys playing basketball.     Constitutional: Denies fever, malaise, fatigue, headache or abrupt weight changes.  HEENT: Pt reports left eye watering, irritation. Denies ear pain, ringing in the ears, wax buildup, runny nose, nasal congestion, bloody nose, or sore throat.  No other specific complaints in a complete review of systems (except as listed in HPI above).  Objective:   Physical Exam   BP 124/86   Pulse 95   Temp 98.2 F (36.8 C) (Oral)   Wt 167 lb (75.8 kg)   SpO2 97%   BMI 23.29 kg/m  Wt Readings from Last 3 Encounters:  02/23/18 167 lb (75.8 kg)  11/27/17 165 lb 4  oz (75 kg)  10/14/17 163 lb 8 oz (74.2 kg)    General: Appears his stated age,  in NAD. HEENT: Head: normal shape and size; Eyes: sclera white, no icterus, conjunctiva pink, PERRLA and EOMs intact. Left Eye Fluorescein Stain: No obvious corneal abrasion.  BMET    Component Value Date/Time   NA 139 03/08/2016 1132   K 4.3 03/08/2016 1132   CL 102 03/08/2016 1132   CO2 29 03/08/2016 1132   GLUCOSE 96 03/08/2016 1132   BUN 15 03/08/2016 1132   CREATININE 0.98 03/08/2016 1132   CALCIUM 9.6 03/08/2016 1132    Lipid Panel  No results found for: CHOL, TRIG, HDL, CHOLHDL, VLDL, LDLCALC  CBC    Component Value Date/Time   WBC 6.5 03/08/2016 1132   RBC 5.61 03/08/2016 1132   HGB 15.7 03/08/2016 1132   HCT 46.3 03/08/2016 1132   PLT 253.0 03/08/2016 1132   MCV 82.6 03/08/2016 1132   MCHC 33.8 03/08/2016 1132   RDW 13.1 03/08/2016 1132    Hgb A1C Lab Results  Component Value Date   HGBA1C 5.2 03/08/2016           Assessment & Plan:   Left  Eye Irritation:  Warm compresses TID prn eRx for Azithromycin eye drops daily x 5  If worse, will have you follow up with ophthalmology  Return precautions discussed Nicki Reaper, NP

## 2018-02-23 NOTE — Telephone Encounter (Signed)
Copied from CRM [id number]. Topic: Quick Communication - See Telephone Encounter >> Feb 23, 2018  2:45 PM Burchel, Abbi R wrote: CRM for notification. See Telephone encounter for: 02/23/18. Pt states his insurance will not cover:  azithromycin (AZASITE) 1 % ophthalmic solution.  Pt is requesting Nicki Reaper call in something different.

## 2018-02-23 NOTE — Telephone Encounter (Signed)
polytrim sent to pharmacy

## 2018-02-24 DIAGNOSIS — H5712 Ocular pain, left eye: Secondary | ICD-10-CM | POA: Diagnosis not present

## 2018-11-10 ENCOUNTER — Telehealth: Payer: Self-pay | Admitting: Primary Care

## 2018-11-10 DIAGNOSIS — L219 Seborrheic dermatitis, unspecified: Secondary | ICD-10-CM

## 2018-11-10 NOTE — Telephone Encounter (Signed)
Pt called checking on rx   Best number [phone]  Pt is out of med

## 2018-11-10 NOTE — Telephone Encounter (Signed)
desonide (DESOWEN) 0.05 % cream Last prescribed on 08/20/2017. Last office visit on 02/23/2018 with [facility].  No future appointment

## 2018-11-10 NOTE — Telephone Encounter (Addendum)
Patient needs follow up visit for refills as we've not seen him in over one year. Can be virtual visit.

## 2018-11-11 NOTE — Telephone Encounter (Signed)
Spoken and notified patient of Anthony Hendricks comments. Patient verbalized understanding. Patient stated he will call back because he really did not want to do a virtual appointment.

## 2019-03-12 ENCOUNTER — Telehealth: Payer: Self-pay

## 2019-03-12 ENCOUNTER — Encounter: Payer: Self-pay | Admitting: Family Medicine

## 2019-03-12 ENCOUNTER — Ambulatory Visit: Payer: BC Managed Care – PPO | Admitting: Family Medicine

## 2019-03-12 ENCOUNTER — Other Ambulatory Visit: Payer: Self-pay

## 2019-03-12 VITALS — BP 150/110 | HR 97 | Temp 98.7°F | Ht 71.0 in | Wt 187.5 lb

## 2019-03-12 DIAGNOSIS — F419 Anxiety disorder, unspecified: Secondary | ICD-10-CM

## 2019-03-12 DIAGNOSIS — F411 Generalized anxiety disorder: Secondary | ICD-10-CM | POA: Insufficient documentation

## 2019-03-12 DIAGNOSIS — I10 Essential (primary) hypertension: Secondary | ICD-10-CM | POA: Diagnosis not present

## 2019-03-12 MED ORDER — METOPROLOL SUCCINATE ER 25 MG PO TB24: 25.0000 mg | ORAL_TABLET | Freq: Every day | ORAL | 6 refills | Status: DC

## 2019-03-12 NOTE — Progress Notes (Signed)
This visit was conducted in person.  BP (!) 150/110 (BP Location: Left Arm, Cuff Size: Normal)   Pulse 97   Temp 98.7 F (37.1 C)   Ht 5\' 11"  (1.803 m)   Wt 187 lb 8 oz (85 kg)   SpO2 97%   BMI 26.15 kg/m   BP Readings from Last 3 Encounters:  03/12/19 (!) 150/110  02/23/18 124/86  11/27/17 118/66    CC: check BP Subjective:    Patient ID: 11/29/17, male    DOB: April 17, 1996, 23 y.o.   MRN: [id number]  HPI: Anthony Hendricks is a 23 y.o. male presenting on 03/12/2019 for Blood Pressure Check (has had some H/As.)   BP elevated at dentist office this week (160/115, 150/110). Currently with mild headache - he attributes this to increased stress of high BP, was told by coworkers he could have a stroke.   Caffeine - 1 cup coffee in am. Drinks 1 cup green tea. Avoids sodas.  No NSAID use.  Limited salt in diet.   No h/o HTN.  ++ fmhx HTN.   Denies vision changes, CP/tightness, SOB, leg swelling.      History reviewed. No pertinent past medical history.  Past Surgical History:  Procedure Laterality Date  . TONSILLECTOMY AND ADENOIDECTOMY      Family History  Problem Relation Age of Onset  . Diabetes Mother   . Hypertension Mother   . Diabetes Sister   . Hypertension Maternal Grandmother     Relevant past medical, surgical, family and social history reviewed and updated as indicated. Interim medical history since our last visit reviewed. Allergies and medications reviewed and updated. Outpatient Medications Prior to Visit  Medication Sig Dispense Refill  . desonide (DESOWEN) 0.05 % cream APPLY TO AFFECTED AREA TWICE DAILY AS NEEDED(FACE)    . trimethoprim-polymyxin b (POLYTRIM) ophthalmic solution Place 1 drop into the left eye every 4 (four) hours. 10 mL 0   No facility-administered medications prior to visit.      Per HPI unless specifically indicated in ROS section below Review of Systems Objective:    BP (!) 150/110 (BP Location: Left Arm, Cuff Size:  Normal)   Pulse 97   Temp 98.7 F (37.1 C)   Ht 5\' 11"  (1.803 m)   Wt 187 lb 8 oz (85 kg)   SpO2 97%   BMI 26.15 kg/m   Wt Readings from Last 3 Encounters:  03/12/19 187 lb 8 oz (85 kg)  02/23/18 167 lb (75.8 kg)  11/27/17 165 lb 4 oz (75 kg)    Physical Exam Vitals signs and nursing note reviewed.  Constitutional:      General: He is not in acute distress.    Appearance: Normal appearance. He is not ill-appearing.  HENT:     Mouth/Throat:     Mouth: Mucous membranes are moist.     Pharynx: Oropharynx is clear. No posterior oropharyngeal erythema.  Cardiovascular:     Rate and Rhythm: Normal rate and regular rhythm.     Pulses: Normal pulses.     Heart sounds: Normal heart sounds. No murmur.  Pulmonary:     Effort: Pulmonary effort is normal. No respiratory distress.     Breath sounds: Normal breath sounds. No wheezing, rhonchi or rales.  Musculoskeletal:     Right lower leg: No edema.     Left lower leg: No edema.  Neurological:     Mental Status: He is alert.  Psychiatric:  Attention and Perception: Attention normal.        Mood and Affect: Mood is anxious.        Speech: Speech normal.     Comments: restless       EKG - NSR rate 75, normal axis, intervals, no acute ST/T changes Assessment & Plan:   Problem List Items Addressed This Visit    Hypertension - Primary    New diagnosis, elevated at dentist office and again today. Reviewed healthy diet, DASH diet handout provided, reviewed AHA recommendations for regular weekly aerobic exercise. Denies significant stressors/anxiety. Strong fmhx HTN. Check baseline EKG. With possible anxiety contributing, will start metoprolol succinate 25mg  daily. He will buy BP cuff to start monitoring at home. RTC 1 mo f/u visit with PCP, consider updated labwork at that time. Pt agrees with plan.       Relevant Medications   metoprolol succinate (TOPROL-XL) 25 MG 24 hr tablet   Other Relevant Orders   EKG 12-Lead (Completed)    Anxiety    Reviewed importance of healthy stress relieving strategies. See below.           Meds ordered this encounter  Medications  . metoprolol succinate (TOPROL-XL) 25 MG 24 hr tablet    Sig: Take 1 tablet (25 mg total) by mouth daily.    Dispense:  30 tablet    Refill:  6   Orders Placed This Encounter  Procedures  . EKG 12-Lead    Follow up plan: Return in about 4 weeks (around 04/09/2019) for follow up visit.  Ria Bush, MD

## 2019-03-12 NOTE — Telephone Encounter (Signed)
Pt saw dentist on 03/11/19 and BP was 160/115 after rest 150/110. Pt said earlier today had h/a for short period but pt is anxious due to people at work telling pt he could be having a stroke.no way to ck BP now. No H/A now.Pt has no covid symptoms, no travel and no known exposure to + covid. Pt has no CP,SOB,H/A,dizziness,weakness in arms or legs. Pt has blurred vision on and off but pt said usually has when gets up to fast. Pt said he has been anxious but feels OK now. EDprecautions given and pt voiced understanding. Pt has appt today with Dr Darnell Level at 12:15.

## 2019-03-12 NOTE — Assessment & Plan Note (Addendum)
New diagnosis, elevated at dentist office and again today. Reviewed healthy diet, DASH diet handout provided, reviewed AHA recommendations for regular weekly aerobic exercise. Denies significant stressors/anxiety. Strong fmhx HTN. Check baseline EKG. With possible anxiety contributing, will start metoprolol succinate 25mg  daily. He will buy BP cuff to start monitoring at home. RTC 1 mo f/u visit with PCP, consider updated labwork at that time. Pt agrees with plan.

## 2019-03-12 NOTE — Telephone Encounter (Signed)
Seen today. 

## 2019-03-12 NOTE — Assessment & Plan Note (Signed)
Reviewed importance of healthy stress relieving strategies. See below.

## 2019-03-12 NOTE — Patient Instructions (Addendum)
Blood pressure is staying high - start toprol XL 25mg  daily. Schedule 1 month follow up for recheck BP.  Work on healthy stress relieving strategies - incorporate aerobic exercise into routine.  Goal blood pressure <140/90, ideally <130/80.  Work on low salt/sodium diet - goal <1.5gm (1,500mg ) per day. Eat a diet high in fruits/vegetables and whole grains.  Look into mediterranean and DASH diet.  Goal activity is 175min/wk of moderate intensity exercise.  This can be split into 30 minute chunks.  If you are not at this level, you can start with smaller 10-15 min increments and slowly build up activity. Look at www.heart.org for more resources.   DASH Eating Plan DASH stands for "Dietary Approaches to Stop Hypertension." The DASH eating plan is a healthy eating plan that has been shown to reduce high blood pressure (hypertension). It may also reduce your risk for type 2 diabetes, heart disease, and stroke. The DASH eating plan may also help with weight loss. What are tips for following this plan?  General guidelines  Avoid eating more than 2,300 mg (milligrams) of salt (sodium) a day. If you have hypertension, you may need to reduce your sodium intake to 1,500 mg a day.  Limit alcohol intake to no more than 1 drink a day for nonpregnant women and 2 drinks a day for men. One drink equals 12 oz of beer, 5 oz of wine, or 1 oz of hard liquor.  Work with your health care provider to maintain a healthy body weight or to lose weight. Ask what an ideal weight is for you.  Get at least 30 minutes of exercise that causes your heart to beat faster (aerobic exercise) most days of the week. Activities may include walking, swimming, or biking.  Work with your health care provider or diet and nutrition specialist (dietitian) to adjust your eating plan to your individual calorie needs. Reading food labels   Check food labels for the amount of sodium per serving. Choose foods with less than 5 percent of the  Daily Value of sodium. Generally, foods with less than 300 mg of sodium per serving fit into this eating plan.  To find whole grains, look for the word "whole" as the first word in the ingredient list. Shopping  Buy products labeled as "low-sodium" or "no salt added."  Buy fresh foods. Avoid canned foods and premade or frozen meals. Cooking  Avoid adding salt when cooking. Use salt-free seasonings or herbs instead of table salt or sea salt. Check with your health care provider or pharmacist before using salt substitutes.  Do not fry foods. Cook foods using healthy methods such as baking, boiling, grilling, and broiling instead.  Cook with heart-healthy oils, such as olive, canola, soybean, or sunflower oil. Meal planning  Eat a balanced diet that includes: ? 5 or more servings of fruits and vegetables each day. At each meal, try to fill half of your plate with fruits and vegetables. ? Up to 6-8 servings of whole grains each day. ? Less than 6 oz of lean meat, poultry, or fish each day. A 3-oz serving of meat is about the same size as a deck of cards. One egg equals 1 oz. ? 2 servings of low-fat dairy each day. ? A serving of nuts, seeds, or beans 5 times each week. ? Heart-healthy fats. Healthy fats called Omega-3 fatty acids are found in foods such as flaxseeds and coldwater fish, like sardines, salmon, and mackerel.  Limit how much you eat of  the following: ? Canned or prepackaged foods. ? Food that is high in trans fat, such as fried foods. ? Food that is high in saturated fat, such as fatty meat. ? Sweets, desserts, sugary drinks, and other foods with added sugar. ? Full-fat dairy products.  Do not salt foods before eating.  Try to eat at least 2 vegetarian meals each week.  Eat more home-cooked food and less restaurant, buffet, and fast food.  When eating at a restaurant, ask that your food be prepared with less salt or no salt, if possible. What foods are recommended?  The items listed may not be a complete list. Talk with your dietitian about what dietary choices are best for you. Grains Whole-grain or whole-wheat bread. Whole-grain or whole-wheat pasta. Brown rice. Orpah Cobbatmeal. Quinoa. Bulgur. Whole-grain and low-sodium cereals. Pita bread. Low-fat, low-sodium crackers. Whole-wheat flour tortillas. Vegetables Fresh or frozen vegetables (raw, steamed, roasted, or grilled). Low-sodium or reduced-sodium tomato and vegetable juice. Low-sodium or reduced-sodium tomato sauce and tomato paste. Low-sodium or reduced-sodium canned vegetables. Fruits All fresh, dried, or frozen fruit. Canned fruit in natural juice (without added sugar). Meat and other protein foods Skinless chicken or Malawiturkey. Ground chicken or [place]urkey. Pork with fat trimmed off. Fish and seafood. Egg whites. Dried beans, peas, or lentils. Unsalted nuts, nut butters, and seeds. Unsalted canned beans. Lean cuts of beef with fat trimmed off. Low-sodium, lean deli meat. Dairy Low-fat (1%) or fat-free (skim) milk. Fat-free, low-fat, or reduced-fat cheeses. Nonfat, low-sodium ricotta or cottage cheese. Low-fat or nonfat yogurt. Low-fat, low-sodium cheese. Fats and oils Soft margarine without trans fats. Vegetable oil. Low-fat, reduced-fat, or light mayonnaise and salad dressings (reduced-sodium). Canola, safflower, olive, soybean, and sunflower oils. Avocado. Seasoning and other foods Herbs. Spices. Seasoning mixes without salt. Unsalted popcorn and pretzels. Fat-free sweets. What foods are not recommended? The items listed may not be a complete list. Talk with your dietitian about what dietary choices are best for you. Grains Baked goods made with fat, such as croissants, muffins, or some breads. Dry pasta or rice meal packs. Vegetables Creamed or fried vegetables. Vegetables in a cheese sauce. Regular canned vegetables (not low-sodium or reduced-sodium). Regular canned tomato sauce and paste (not low-sodium or  reduced-sodium). Regular tomato and vegetable juice (not low-sodium or reduced-sodium). Rosita FirePickles. Olives. Fruits Canned fruit in a light or heavy syrup. Fried fruit. Fruit in cream or butter sauce. Meat and other protein foods Fatty cuts of meat. Ribs. Fried meat. Tomasa BlaseBacon. Sausage. Bologna and other processed lunch meats. Salami. Fatback. Hotdogs. Bratwurst. Salted nuts and seeds. Canned beans with added salt. Canned or smoked fish. Whole eggs or egg yolks. Chicken or Malaw[place]urkey with skin. Dairy Whole or 2% milk, cream, and half-and-half. Whole or full-fat cream cheese. Whole-fat or sweetened yogurt. Full-fat cheese. Nondairy creamers. Whipped toppings. Processed cheese and cheese spreads. Fats and oils Butter. Stick margarine. Lard. Shortening. Ghee. Bacon fat. Tropical oils, such as coconut, palm kernel, or palm oil. Seasoning and other foods Salted popcorn and pretzels. Onion salt, garlic salt, seasoned salt, table salt, and sea salt. Worcestershire sauce. Tartar sauce. Barbecue sauce. Teriyaki sauce. Soy sauce, including reduced-sodium. Steak sauce. Canned and packaged gravies. Fish sauce. Oyster sauce. Cocktail sauce. Horseradish that you find on the shelf. Ketchup. Mustard. Meat flavorings and tenderizers. Bouillon cubes. Hot sauce and Tabasco sauce. Premade or packaged marinades. Premade or packaged taco seasonings. Relishes. Regular salad dressings. Where to find more information:  National Heart, Lung, and Blood Institute: PopSteam.iswww.nhlbi.nih.gov  American Heart Association:  www.heart.org Summary  The DASH eating plan is a healthy eating plan that has been shown to reduce high blood pressure (hypertension). It may also reduce your risk for type 2 diabetes, heart disease, and stroke.  With the DASH eating plan, you should limit salt (sodium) intake to 2,300 mg a day. If you have hypertension, you may need to reduce your sodium intake to 1,500 mg a day.  When on the DASH eating plan, aim to eat more  fresh fruits and vegetables, whole grains, lean proteins, low-fat dairy, and heart-healthy fats.  Work with your health care provider or diet and nutrition specialist (dietitian) to adjust your eating plan to your individual calorie needs. This information is not intended to replace advice given to you by your health care provider. Make sure you discuss any questions you have with your health care provider. Document Released: 05/23/2011 Document Revised: 05/16/2017 Document Reviewed: 05/27/2016 Elsevier Patient Education  2020 [employer].

## 2019-03-12 NOTE — Telephone Encounter (Signed)
Left message for patient to call back about EKG results

## 2019-04-07 ENCOUNTER — Ambulatory Visit: Payer: BLUE CROSS/BLUE SHIELD | Admitting: Adult Health

## 2019-04-12 ENCOUNTER — Ambulatory Visit: Payer: BLUE CROSS/BLUE SHIELD | Admitting: Primary Care

## 2019-04-12 ENCOUNTER — Other Ambulatory Visit: Payer: Self-pay

## 2019-04-12 ENCOUNTER — Encounter: Payer: Self-pay | Admitting: Primary Care

## 2019-04-12 VITALS — BP 146/94 | HR 82 | Temp 97.5°F | Ht 71.0 in | Wt 189.8 lb

## 2019-04-12 DIAGNOSIS — I10 Essential (primary) hypertension: Secondary | ICD-10-CM | POA: Diagnosis not present

## 2019-04-12 DIAGNOSIS — F411 Generalized anxiety disorder: Secondary | ICD-10-CM

## 2019-04-12 MED ORDER — CITALOPRAM HYDROBROMIDE 20 MG PO TABS: 20.0000 mg | ORAL_TABLET | Freq: Every day | ORAL | 1 refills | Status: DC

## 2019-04-12 NOTE — Assessment & Plan Note (Signed)
Chronic, GAD 7 score of 17 today. Discussed options for treatment, will start with Celexa 20 mg.   Patient is to take 1/2 tablet daily for 8 days, then advance to 1 full tablet thereafter. We discussed possible side effects of headache, GI upset, drowsiness, and SI/HI. If thoughts of SI/HI develop, we discussed to present to the emergency immediately. Patient verbalized understanding.   Follow up in 1 month for evaluation.

## 2019-04-12 NOTE — Assessment & Plan Note (Addendum)
Above goal in the office today, very slight improvement on metoprolol succinate.  Long discussion today regarding his BP readings and the potential cause. Suspect elevated readings are highly influenced by daily stress/anxiety, he agrees.  Continue metoprolol succinate nightly for now. Add in treatment for anxiety. We will see him back in 1 month for follow up, consider adding lisinopril if needed.  Check labs including CMP, TSH, A1C.

## 2019-04-12 NOTE — Patient Instructions (Signed)
Start citalopram (Celexa) 20 mg tablets for anxiety. Start by taking 1/2 tablet nightly for 8 days, then increase to 1 full tablet thereafter.  Continue taking metoprolol succinate at bedtime for blood pressure.  Try to check your blood pressure a few times before your next visit.  Stop by the lab prior to leaving today. I will notify you of your results once received.   Please schedule a follow up appointment in 1 month.  It was a pleasure to see you today!

## 2019-04-12 NOTE — Progress Notes (Signed)
Subjective:    Patient ID: Anthony Hendricks, male    DOB: [birth year], 23 y.o.   MRN: [id number]  HPI  Anthony Hendricks is a 23 year old male with a history of elevated blood pressure readings, anxiety, difficulty concentrating who presents today for follow up of blood pressure and anxiety.   He was last evaluated one month ago for elevated blood pressure per Dr. Sharen Hones. He was at his dentists's office earlier that week with readings of 160/115 and 150/110. He admitted stress/anxiety, also some caffeine intake. Family history of hypertension.  Given his elevated readings coupled with anxiety/stress he was initiated on metoprolol succinate 25 mg and asked to follow up one month later.  Since his last visit he's not check his BP. He feels well overall, some stress but overall manages anxiety/stress on his own. He's compliant to his metoprolol succinate, did have some dizziness initially so he started taking at night. He took his last metoprolol succinate last night, has felt as though this helps him to sleep at night and feels more rested.   BP Readings from Last 3 Encounters:  04/12/19 (!) 146/94  03/12/19 (!) 150/110  02/23/18 124/86   He has daily symptoms of anxiousness, nervousness, doesn't want to be in crowded places, uneasy, daily worry. These symptoms will occurs intermittently throughout they day. GAD 7 score of 17. He was once treated on Wellbutrin in the past but this caused him to feel depressed.   Review of Systems  Eyes: Negative for visual disturbance.  Respiratory: Negative for shortness of breath.   Cardiovascular: Negative for chest pain.  Neurological: Negative for dizziness and headaches.  Psychiatric/Behavioral: The patient is nervous/anxious.        See HPI       No past medical history on file.   Social History   Socioeconomic History  . Marital status: Married    Spouse name: Not on file  . Number of children: Not on file  . Years of education: Not on file   . Highest education level: Not on file  Occupational History  . Not on file  Social Needs  . Financial resource strain: Not on file  . Food insecurity    Worry: Not on file    Inability: Not on file  . Transportation needs    Medical: Not on file    Non-medical: Not on file  Tobacco Use  . Smoking status: Never Smoker  . Smokeless tobacco: Never Used  Substance and Sexual Activity  . Alcohol use: Yes    Alcohol/week: 0.0 standard drinks    Comment: social  . Drug use: No  . Sexual activity: Not on file  Lifestyle  . Physical activity    Days per week: Not on file    Minutes per session: Not on file  . Stress: Not on file  Relationships  . Social Musician on phone: Not on file    Gets together: Not on file    Attends religious service: Not on file    Active member of club or organization: Not on file    Attends meetings of clubs or organizations: Not on file    Relationship status: Not on file  . Intimate partner violence    Fear of current or ex partner: Not on file    Emotionally abused: Not on file    Physically abused: Not on file    Forced sexual activity: Not on file  Other Topics Concern  .  Not on file  Social History Narrative   Single.   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   Highest level of education 10th grade.   Enjoys playing basketball.    Past Surgical History:  Procedure Laterality Date  . TONSILLECTOMY AND ADENOIDECTOMY      Family History  Problem Relation Age of Onset  . Diabetes Mother   . Hypertension Mother   . Diabetes Sister   . Hypertension Maternal Grandmother   . Diabetes Maternal Grandmother   . Diabetes Brother     No Known Allergies  Current Outpatient Medications on File Prior to Visit  Medication Sig Dispense Refill  . desonide (DESOWEN) 0.05 % cream APPLY TO AFFECTED AREA TWICE DAILY AS NEEDED(FACE)    . metoprolol succinate (TOPROL-XL) 25 MG 24 hr tablet Take 1 tablet (25 mg total) by mouth daily. 30 tablet  6   No current facility-administered medications on file prior to visit.     BP (!) 146/94   Pulse 82   Temp (!) 97.5 F (36.4 C) (Temporal)   Ht 5\' 11"  (1.803 m)   Wt 189 lb 12 oz (86.1 kg)   SpO2 98%   BMI 26.46 kg/m    Objective:   Physical Exam  Constitutional: He appears well-nourished.  Neck: Neck supple.  Cardiovascular: Normal rate and regular rhythm.  Respiratory: Effort normal and breath sounds normal.  Skin: Skin is warm and dry.  Psychiatric: He has a normal mood and affect.           Assessment & Plan:

## 2019-04-13 LAB — COMPREHENSIVE METABOLIC PANEL
ALT: 20 U/L (ref 0–53)
AST: 15 U/L (ref 0–37)
Albumin: 5.1 g/dL (ref 3.5–5.2)
Alkaline Phosphatase: 56 U/L (ref 39–117)
BUN: 12 mg/dL (ref 6–23)
CO2: 31 mEq/L (ref 19–32)
Calcium: 10 mg/dL (ref 8.4–10.5)
Chloride: 102 mEq/L (ref 96–112)
Creatinine, Ser: 0.89 mg/dL (ref 0.40–1.50)
GFR: 103.79 mL/min (ref >60.00)
Glucose, Bld: 99 mg/dL (ref 70–99)
Potassium: 4.5 mEq/L (ref 3.5–5.1)
Sodium: 139 mEq/L (ref 135–145)
Total Bilirubin: 0.4 mg/dL (ref 0.2–1.2)
Total Protein: 6.9 g/dL (ref 6.0–8.3)

## 2019-04-13 LAB — LIPID PANEL
Cholesterol: 189 mg/dL (ref 0–200)
HDL: 37.7 mg/dL — ABNORMAL LOW (ref >39.00)
LDL Cholesterol: 122 mg/dL — ABNORMAL HIGH (ref 0–99)
NonHDL: 150.84
Total CHOL/HDL Ratio: 5
Triglycerides: 144 mg/dL (ref 0.0–149.0)
VLDL: 28.8 mg/dL (ref 0.0–40.0)

## 2019-04-13 LAB — HEMOGLOBIN A1C: Hgb A1c MFr Bld: 5.4 % (ref 4.6–6.5)

## 2019-04-15 LAB — TSH: TSH: 1.48 u[IU]/mL (ref 0.35–4.50)

## 2019-05-12 ENCOUNTER — Encounter: Payer: Self-pay | Admitting: Primary Care

## 2019-05-12 ENCOUNTER — Other Ambulatory Visit: Payer: Self-pay

## 2019-05-12 ENCOUNTER — Ambulatory Visit: Payer: BLUE CROSS/BLUE SHIELD | Admitting: Primary Care

## 2019-05-12 DIAGNOSIS — F411 Generalized anxiety disorder: Secondary | ICD-10-CM | POA: Diagnosis not present

## 2019-05-12 DIAGNOSIS — I10 Essential (primary) hypertension: Secondary | ICD-10-CM | POA: Diagnosis not present

## 2019-05-12 MED ORDER — CITALOPRAM HYDROBROMIDE 20 MG PO TABS: 20.0000 mg | ORAL_TABLET | Freq: Every day | ORAL | 1 refills | Status: DC

## 2019-05-12 NOTE — Patient Instructions (Signed)
Continue taking citalopram 20 mg once daily for anxiety. I sent refills to your pharmacy.   It was a pleasure to see you today!

## 2019-05-12 NOTE — Assessment & Plan Note (Signed)
Improved on citalopram 20 mg, doesn't have significant side effects. Denies SI/HI. Continue same, refills sent to pharmacy.

## 2019-05-12 NOTE — Assessment & Plan Note (Signed)
Significantly improved, likely anxiety related. Continue metoprolol per patient preference.  Continue anxiety treatment.

## 2019-05-12 NOTE — Progress Notes (Signed)
Subjective:    Patient ID: Anthony Hendricks, male    DOB: 1995/06/22, 23 y.o.   MRN: [id number]  HPI  Mr. Hankerson is a 23 year old male who presents today for follow up of anxiety and hypertension.  He was last evaluated on 04/12/19 for follow up of hypertension, was placed on metoprolol succinate by Dr. Sharen Hones, no improvement in BP levels or anxiety. GAD 7 score of 17 so we initiated him on Celexa 20 mg. We also continued his metoprolol succinate as he felt symptoms were suspected to be more from anxiety, also metoprolol had helped him sleeping at night.   Since his last visit he's doing better. Positive effects include less dwelling, less anxiety, able to handle stressful situations. He did forget to take his medication one day and noticed increased anxiety. He denies SI/HI. He does have intermittent headaches that are not bothersome.  BP Readings from Last 3 Encounters:  05/12/19 124/86  04/12/19 (!) 146/94  03/12/19 (!) 150/110     Review of Systems  Respiratory: Negative for shortness of breath.   Cardiovascular: Negative for chest pain.  Gastrointestinal: Negative for nausea.  Neurological: Positive for headaches.  Psychiatric/Behavioral:       See HPI       No past medical history on file.   Social History   Socioeconomic History  . Marital status: Married    Spouse name: Not on file  . Number of children: Not on file  . Years of education: Not on file  . Highest education level: Not on file  Occupational History  . Not on file  Social Needs  . Financial resource strain: Not on file  . Food insecurity    Worry: Not on file    Inability: Not on file  . Transportation needs    Medical: Not on file    Non-medical: Not on file  Tobacco Use  . Smoking status: Never Smoker  . Smokeless tobacco: Never Used  Substance and Sexual Activity  . Alcohol use: Yes    Alcohol/week: 0.0 standard drinks    Comment: social  . Drug use: No  . Sexual activity: Not on file   Lifestyle  . Physical activity    Days per week: Not on file    Minutes per session: Not on file  . Stress: Not on file  Relationships  . Social Musician on phone: Not on file    Gets together: Not on file    Attends religious service: Not on file    Active member of club or organization: Not on file    Attends meetings of clubs or organizations: Not on file    Relationship status: Not on file  . Intimate partner violence    Fear of current or ex partner: Not on file    Emotionally abused: Not on file    Physically abused: Not on file    Forced sexual activity: Not on file  Other Topics Concern  . Not on file  Social History Narrative   Single.   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   Highest level of education 10th grade.   Enjoys playing basketball.    Past Surgical History:  Procedure Laterality Date  . TONSILLECTOMY AND ADENOIDECTOMY      Family History  Problem Relation Age of Onset  . Diabetes Mother   . Hypertension Mother   . Diabetes Sister   . Hypertension Maternal Grandmother   . Diabetes Maternal  Grandmother   . Diabetes Brother     No Known Allergies  Current Outpatient Medications on File Prior to Visit  Medication Sig Dispense Refill  . desonide (DESOWEN) 0.05 % cream APPLY TO AFFECTED AREA TWICE DAILY AS NEEDED(FACE)    . metoprolol succinate (TOPROL-XL) 25 MG 24 hr tablet Take 1 tablet (25 mg total) by mouth daily. 30 tablet 6   No current facility-administered medications on file prior to visit.     BP 124/86   Pulse 80   Temp (!) 97 F (36.1 C) (Temporal)   Ht 5\' 11"  (1.803 m)   Wt 190 lb 8 oz (86.4 kg)   SpO2 98%   BMI 26.57 kg/m    Objective:   Physical Exam  Constitutional: He appears well-nourished.  Neck: Neck supple.  Cardiovascular: Normal rate and regular rhythm.  Respiratory: Effort normal and breath sounds normal.  Skin: Skin is warm and dry.  Psychiatric: He has a normal mood and affect.            Assessment & Plan:

## 2019-05-17 ENCOUNTER — Ambulatory Visit: Payer: BLUE CROSS/BLUE SHIELD | Admitting: Primary Care

## 2019-06-03 ENCOUNTER — Telehealth: Payer: Self-pay | Admitting: Primary Care

## 2019-06-03 NOTE — Telephone Encounter (Signed)
Spoken and notified patient of Kate Clark's comments. Patient verbalized understanding.  

## 2019-06-03 NOTE — Telephone Encounter (Signed)
Please notify patient that unfortunately I cannot provide him with a note to not wear a mask while at work. He could try other forms of masks such as bandana, etc.

## 2019-06-03 NOTE — Telephone Encounter (Signed)
Patient stated that he has psoriasis on his face.  He is requesting a note for work to not wear a mask when he is by himself.  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  Patient stated that he will wear the mask when he is around others and out in public.

## 2019-06-03 NOTE — Telephone Encounter (Signed)
Message left for patient to return my call.  

## 2019-07-19 ENCOUNTER — Encounter: Payer: Self-pay | Admitting: Primary Care

## 2019-07-19 ENCOUNTER — Ambulatory Visit (INDEPENDENT_AMBULATORY_CARE_PROVIDER_SITE_OTHER): Payer: BLUE CROSS/BLUE SHIELD | Admitting: Primary Care

## 2019-07-19 ENCOUNTER — Other Ambulatory Visit: Payer: Self-pay

## 2019-07-19 VITALS — Wt 190.0 lb

## 2019-07-19 DIAGNOSIS — T753XXA Motion sickness, initial encounter: Secondary | ICD-10-CM | POA: Insufficient documentation

## 2019-07-19 DIAGNOSIS — F411 Generalized anxiety disorder: Secondary | ICD-10-CM

## 2019-07-19 DIAGNOSIS — T753XXS Motion sickness, sequela: Secondary | ICD-10-CM

## 2019-07-19 MED ORDER — ESCITALOPRAM OXALATE 10 MG PO TABS: 10.0000 mg | ORAL_TABLET | Freq: Every day | ORAL | 0 refills | Status: DC

## 2019-07-19 NOTE — Progress Notes (Signed)
Subjective:    Patient ID: Anthony Hendricks, male    DOB: 08-21-1995, 24 y.o.   MRN: [id number]  HPI  Virtual Visit via Video Note  I connected with Anthony Hendricks on 07/19/19 at  2:40 PM EST by a video enabled telemedicine application and verified that I am speaking with the correct person using two identifiers.  Location: Patient: Work Provider: Office   I discussed the limitations of evaluation and management by telemedicine and the availability of in person appointments. The patient expressed understanding and agreed to proceed.  History of Present Illness:  [name] is a 24 year old male with a history of GAD, difficulty concentrating, motion sickness who presents today with a chief complaint of motion sickness.  History of motion sickness that dates back to years ago, only occurs when he's riding (not driving) in a vehicle. Over the last several months he's noticed motion sickness each time he drives. He doesn't ever ride in the passenger seat due to his history of motion sickness. Once he stops driving his symptoms will resolve in about 20 minutes. He denies motion sickness when not driving, chest pain, shortness of breath, dizziness.   He's also noticing vivid dreams/nightmares. He will dream about scary clowns, monsters, zombies.   He's not taking metoprolol. He's drinking a lot of water during the day. he started Citalopram in Fall 2020, feels like it's helping well and doesn't wish to come off. Each time he goes to bed he's having nightmares of clowns, monsters, etc.   Observations/Objective:  Alert and oriented. Appears well, not sickly. No distress. Speaking in complete sentences.   Assessment and Plan:  See problem based charting.  Follow Up Instructions:  Stop citalopram, start escitalopram 10 mg once daily for anxiety.  Please update me regarding those symptoms in a few weeks.  It was a pleasure to see you today! Allie Bossier, NP-C    I discussed the  assessment and treatment plan with the patient. The patient was provided an opportunity to ask questions and all were answered. The patient agreed with the plan and demonstrated an understanding of the instructions.   The patient was advised to call back or seek an in-person evaluation if the symptoms worsen or if the condition fails to improve as anticipated.    Pleas Koch, NP    Review of Systems  Eyes: Negative for visual disturbance.  Respiratory: Negative for shortness of breath.   Cardiovascular: Negative for chest pain.  Neurological:       Motion sickness  Psychiatric/Behavioral: Positive for sleep disturbance. The patient is not nervous/anxious.        No past medical history on file.   Social History   Socioeconomic History  . Marital status: Married    Spouse name: Not on file  . Number of children: Not on file  . Years of education: Not on file  . Highest education level: Not on file  Occupational History  . Not on file  Tobacco Use  . Smoking status: Never Smoker  . Smokeless tobacco: Never Used  Substance and Sexual Activity  . Alcohol use: Yes    Alcohol/week: 0.0 standard drinks    Comment: social  . Drug use: No  . Sexual activity: Not on file  Other Topics Concern  . Not on file  Social History Narrative   Single.   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   Highest level of education 10th grade.   Enjoys playing basketball.  Social Determinants of Health   Financial Resource Strain:   . Difficulty of Paying Living Expenses: Not on file  Food Insecurity:   . Worried About Programme researcher, broadcasting/film/video in the Last Year: Not on file  . Ran Out of Food in the Last Year: Not on file  Transportation Needs:   . Lack of Transportation (Medical): Not on file  . Lack of Transportation (Non-Medical): Not on file  Physical Activity:   . Days of Exercise per Week: Not on file  . Minutes of Exercise per Session: Not on file  Stress:   . Feeling of Stress :  Not on file  Social Connections:   . Frequency of Communication with Friends and Family: Not on file  . Frequency of Social Gatherings with Friends and Family: Not on file  . Attends Religious Services: Not on file  . Active Member of Clubs or Organizations: Not on file  . Attends Banker Meetings: Not on file  . Marital Status: Not on file  Intimate Partner Violence:   . Fear of Current or Ex-Partner: Not on file  . Emotionally Abused: Not on file  . Physically Abused: Not on file  . Sexually Abused: Not on file    Past Surgical History:  Procedure Laterality Date  . TONSILLECTOMY AND ADENOIDECTOMY      Family History  Problem Relation Age of Onset  . Diabetes Mother   . Hypertension Mother   . Diabetes Sister   . Hypertension Maternal Grandmother   . Diabetes Maternal Grandmother   . Diabetes Brother     No Known Allergies  Current Outpatient Medications on File Prior to Visit  Medication Sig Dispense Refill  . citalopram (CELEXA) 20 MG tablet Take 1 tablet (20 mg total) by mouth daily. For anxiety. 90 tablet 1  . desonide (DESOWEN) 0.05 % cream APPLY TO AFFECTED AREA TWICE DAILY AS NEEDED(FACE)    . metoprolol succinate (TOPROL-XL) 25 MG 24 hr tablet Take 1 tablet (25 mg total) by mouth daily. (Patient not taking: Reported on 07/19/2019) 30 tablet 6   No current facility-administered medications on file prior to visit.    Wt 190 lb (86.2 kg)   BMI 26.50 kg/m  ' Objective:   Physical Exam  Constitutional: He is oriented to person, place, and time. He appears well-nourished.  Respiratory: Effort normal.  Neurological: He is alert and oriented to person, place, and time.  Psychiatric: He has a normal mood and affect.           Assessment & Plan:

## 2019-07-19 NOTE — Patient Instructions (Signed)
Stop citalopram, start escitalopram 10 mg once daily for anxiety.  Please update me regarding those symptoms in a few weeks.  It was a pleasure to see you today! Mayra Reel, NP-C

## 2019-07-19 NOTE — Assessment & Plan Note (Signed)
Chronic history when riding in vehicles as a passenger, no problems until just a few months ago.   Suspect this could be secondary to citalopram as he is hydrating well and has no symptoms outside of driving. Will discontinue citalopram, switch to Lexapro.  He will update in a few weeks. If no improvement then consider labs, ECG, etc.

## 2019-07-19 NOTE — Assessment & Plan Note (Signed)
Doing well on citalopram but suspect it's causing vivid dreams/nightmares, may also be contributing to motion sickness given nausea side effect profile.  Will switch to Lexapro to see if this reduces side effects. He will update in a few weeks regarding side effects.

## 2019-08-26 ENCOUNTER — Telehealth: Payer: Self-pay
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
## 2019-08-26 NOTE — Telephone Encounter (Signed)
Noted  

## 2019-08-30 ENCOUNTER — Ambulatory Visit (INDEPENDENT_AMBULATORY_CARE_PROVIDER_SITE_OTHER): Payer: BLUE CROSS/BLUE SHIELD | Admitting: Primary Care

## 2019-08-30 ENCOUNTER — Encounter: Payer: Self-pay | Admitting: Primary Care

## 2019-08-30 ENCOUNTER — Other Ambulatory Visit: Payer: Self-pay

## 2019-08-30 VITALS — BP 124/84 | HR 80 | Temp 97.6°F | Ht 71.0 in | Wt 193.2 lb

## 2019-08-30 DIAGNOSIS — L219 Seborrheic dermatitis, unspecified: Secondary | ICD-10-CM | POA: Diagnosis not present

## 2019-08-30 DIAGNOSIS — F411 Generalized anxiety disorder: Secondary | ICD-10-CM

## 2019-08-30 MED ORDER — ESCITALOPRAM OXALATE 20 MG PO TABS: 20.0000 mg | ORAL_TABLET | Freq: Every day | ORAL | 3 refills | Status: DC

## 2019-08-30 MED ORDER — ALPRAZOLAM 0.25 MG PO TABS: 0.2500 mg | ORAL_TABLET | Freq: Every day | ORAL | 0 refills | Status: DC | PRN

## 2019-08-30 NOTE — Assessment & Plan Note (Signed)
Doing well on PRN desonide. Continue same.

## 2019-08-30 NOTE — Assessment & Plan Note (Signed)
Overall doing well on Lexapro 10 mg, he may benefit from a dose increase. I did also offer him a few tablets of 0.25 mg Xanax to use very sparingly for anxiety/panic attacks.   We discussed that Arthor Captain is used very sparingly, only use during panic attacks. Increased Lexapro to 20 mg. He will update me in one month via My Chart.

## 2019-08-30 NOTE — Progress Notes (Signed)
Subjective:    Patient ID: Anthony Hendricks, male    DOB: 05-Jan-1996, 24 y.o.   MRN: [id number]  HPI  This visit occurred during the SARS-CoV-2 public health emergency.  Safety protocols were in place, including screening questions prior to the visit, additional usage of staff PPE, and extensive cleaning of exam room while observing appropriate contact time as indicated for disinfecting solutions.    Anthony Hendricks is a 24 year old male with a history of GAD, difficulty concentrating, chronic lumbar spine pain who presents today to discuss anxiety.   He had an episode last week of anxiety after speaking with his mother in law who was threatening suicide. Symptoms included pacing the floor, hands shaking, sweating, thinking bad thoughts. He was at work during this episode, admitted he had forgotten to take his dose of Lexapro the day before, did have some citalopram on hand. He called our office and was advised to take Lexapro when he returned home. His boss ended up giving him 0.25 mg of Xanax which helped.   He endorses improvement overall with lexapro, does continues to notice mild anxiety with symptoms of chest heaviness/tightness. He is wondering if there is an opportunity to completely resolve his symptoms of anxiety.   Review of Systems  Respiratory: Positive for chest tightness.        See HPI  Psychiatric/Behavioral: The patient is nervous/anxious.        See HPI       No past medical history on file.   Social History   Socioeconomic History  . Marital status: Married    Spouse name: Not on file  . Number of children: Not on file  . Years of education: Not on file  . Highest education level: Not on file  Occupational History  . Not on file  Tobacco Use  . Smoking status: Never Smoker  . Smokeless tobacco: Never Used  Substance and Sexual Activity  . Alcohol use: Yes    Alcohol/week: 0.0 standard drinks    Comment: social  . Drug use: No  . Sexual activity: Not on file    Other Topics Concern  . Not on file  Social History Narrative   Single.   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   Highest level of education 10th grade.   Enjoys playing basketball.   Social Determinants of Health   Financial Resource Strain:   . Difficulty of Paying Living Expenses:   Food Insecurity:   . Worried About Programme researcher, broadcasting/film/video in the Last Year:   . Barista in the Last Year:   Transportation Needs:   . Freight forwarder (Medical):   Marland Kitchen Lack of Transportation (Non-Medical):   Physical Activity:   . Days of Exercise per Week:   . Minutes of Exercise per Session:   Stress:   . Feeling of Stress :   Social Connections:   . Frequency of Communication with Friends and Family:   . Frequency of Social Gatherings with Friends and Family:   . Attends Religious Services:   . Active Member of Clubs or Organizations:   . Attends Banker Meetings:   Marland Kitchen Marital Status:   Intimate Partner Violence:   . Fear of Current or Ex-Partner:   . Emotionally Abused:   Marland Kitchen Physically Abused:   . Sexually Abused:     Past Surgical History:  Procedure Laterality Date  . TONSILLECTOMY AND ADENOIDECTOMY      Family History  Problem Relation Age of Onset  . Diabetes Mother   . Hypertension Mother   . Diabetes Sister   . Hypertension Maternal Grandmother   . Diabetes Maternal Grandmother   . Diabetes Brother     No Known Allergies  Current Outpatient Medications on File Prior to Visit  Medication Sig Dispense Refill  . desonide (DESOWEN) 0.05 % cream APPLY TO AFFECTED AREA TWICE DAILY AS NEEDED(FACE)     No current facility-administered medications on file prior to visit.    BP 124/84   Pulse 80   Temp 97.6 F (36.4 C) (Temporal)   Ht 5\' 11"  (1.803 m)   Wt 193 lb 4 oz (87.7 kg)   SpO2 98%   BMI 26.95 kg/m    Objective:   Physical Exam  Constitutional: He appears well-nourished.  Cardiovascular: Normal rate and regular rhythm.  Respiratory:  Effort normal and breath sounds normal.  Musculoskeletal:     Cervical back: Neck supple.  Skin: Skin is warm and dry.  Psychiatric: He has a normal mood and affect.           Assessment & Plan:

## 2019-08-30 NOTE — Patient Instructions (Signed)
We've increased the dose of your escitalopram (Lexapro) to 20 mg.   Use the alprazolam (Xanax) very sparingly as needed for panic attacks.  Please update me via My Chart in one month.  It was a pleasure to see you today!

## 2019-10-10 ENCOUNTER — Other Ambulatory Visit: Payer: Self-pay | Admitting: Primary Care

## 2019-10-10 DIAGNOSIS — F411 Generalized anxiety disorder: Secondary | ICD-10-CM

## 2019-11-26 ENCOUNTER — Ambulatory Visit (INDEPENDENT_AMBULATORY_CARE_PROVIDER_SITE_OTHER)
Admission: RE | Admit: 2019-11-26 | Discharge: 2019-11-26 | Disposition: A | Discharge status: Home / Self Care | Payer: BLUE CROSS/BLUE SHIELD | Source: Ambulatory Visit | Attending: Primary Care | Admitting: Primary Care

## 2019-11-26 ENCOUNTER — Ambulatory Visit (INDEPENDENT_AMBULATORY_CARE_PROVIDER_SITE_OTHER): Payer: BLUE CROSS/BLUE SHIELD | Admitting: Primary Care

## 2019-11-26 ENCOUNTER — Encounter: Payer: Self-pay | Admitting: Primary Care

## 2019-11-26 ENCOUNTER — Other Ambulatory Visit: Payer: Self-pay

## 2019-11-26 ENCOUNTER — Other Ambulatory Visit: Payer: Self-pay | Admitting: Primary Care

## 2019-11-26 VITALS — BP 112/76 | HR 70 | Temp 95.6°F | Ht 71.0 in | Wt 192.5 lb

## 2019-11-26 DIAGNOSIS — F411 Generalized anxiety disorder: Secondary | ICD-10-CM

## 2019-11-26 DIAGNOSIS — R1030 Lower abdominal pain, unspecified: Secondary | ICD-10-CM

## 2019-11-26 DIAGNOSIS — L219 Seborrheic dermatitis, unspecified: Secondary | ICD-10-CM | POA: Diagnosis not present

## 2019-11-26 DIAGNOSIS — R109 Unspecified abdominal pain: Secondary | ICD-10-CM | POA: Diagnosis not present

## 2019-11-26 HISTORY — DX: Essential (primary) hypertension: I10

## 2019-11-26 LAB — COMPREHENSIVE METABOLIC PANEL
ALT: 18 U/L (ref 0–53)
AST: 16 U/L (ref 0–37)
Albumin: 4.9 g/dL (ref 3.5–5.2)
Alkaline Phosphatase: 71 U/L (ref 39–117)
BUN: 12 mg/dL (ref 6–23)
CO2: 28 mEq/L (ref 19–32)
Calcium: 9.9 mg/dL (ref 8.4–10.5)
Chloride: 106 mEq/L (ref 96–112)
Creatinine, Ser: 0.92 mg/dL (ref 0.40–1.50)
GFR: 99.4 mL/min (ref >60.00)
Glucose, Bld: 102 mg/dL — ABNORMAL HIGH (ref 70–99)
Potassium: 4.2 mEq/L (ref 3.5–5.1)
Sodium: 139 mEq/L (ref 135–145)
Total Bilirubin: 0.5 mg/dL (ref 0.2–1.2)
Total Protein: 6.8 g/dL (ref 6.0–8.3)

## 2019-11-26 LAB — CBC WITH DIFFERENTIAL/PLATELET
Basophils Absolute: 0 10*3/uL (ref 0.0–0.1)
Basophils Relative: 0.5 % (ref 0.0–3.0)
Eosinophils Absolute: 0.1 10*3/uL (ref 0.0–0.7)
Eosinophils Relative: 2 % (ref 0.0–5.0)
HCT: 46.8 % (ref 39.0–52.0)
Hemoglobin: 15.7 g/dL (ref 13.0–17.0)
Lymphocytes Relative: 42 % (ref 12.0–46.0)
Lymphs Abs: 2.3 10*3/uL (ref 0.7–4.0)
MCHC: 33.7 g/dL (ref 30.0–36.0)
MCV: 83.1 fl (ref 78.0–100.0)
Monocytes Absolute: 0.5 10*3/uL (ref 0.1–1.0)
Monocytes Relative: 9.3 % (ref 3.0–12.0)
Neutro Abs: 2.6 10*3/uL (ref 1.4–7.7)
Neutrophils Relative %: 46.2 % (ref 43.0–77.0)
Platelets: 251 10*3/uL (ref 150.0–400.0)
RBC: 5.62 Mil/uL (ref 4.22–5.81)
RDW: 13.3 % (ref 11.5–15.5)
WBC: 5.5 10*3/uL (ref 4.0–10.5)

## 2019-11-26 MED ORDER — IOHEXOL 300 MG/ML  SOLN
100.0000 mL | Freq: Once | INTRAMUSCULAR | Status: AC | PRN
  Administered 2019-11-26: 100 mL via INTRAVENOUS

## 2019-11-26 MED ORDER — DESONIDE 0.05 % EX CREA: TOPICAL_CREAM | CUTANEOUS | 0 refills | Status: AC

## 2019-11-26 MED ORDER — SERTRALINE HCL 50 MG PO TABS: 50.0000 mg | ORAL_TABLET | Freq: Every day | ORAL | 0 refills | Status: AC

## 2019-11-26 NOTE — Patient Instructions (Signed)
Stop by the lab prior to leaving today. I will notify you of your results once received.   Stop by the front desk and speak with either Shirlee Limerick or Charmaine regarding your CT scan.  I'll be in touch later with results.   It was a pleasure to see you today!

## 2019-11-26 NOTE — Assessment & Plan Note (Signed)
Acute for the last one month, worse with eating. Exam today with moderate tenderness to RLQ and referred pain to right periumbilical region on palpation. Also with guarding.  Exam today suspicious for acute appendicitis, however, he does appear very stable. Stat CT abdomen/pelvis ordered. Checking labs including CMP and CBC with diff.  Await results.

## 2019-11-26 NOTE — Assessment & Plan Note (Signed)
Doing well on desonide, refill sent to pharmacy.

## 2019-11-26 NOTE — Assessment & Plan Note (Signed)
Significant improvement with lexapro 20 mg. I'm not entirely convinced that his abdominal pain is secondary to Lexapro, especially given that he's ben taking this for months.   Consider switching to Zoloft if abdominal symptoms are secondary to Lexapro. Await abdominal work up.

## 2019-11-26 NOTE — Progress Notes (Signed)
Subjective:    Patient ID: Anthony Hendricks, male    DOB: 1995/10/25, 24 y.o.   MRN: [id number]  HPI  This visit occurred during the SARS-CoV-2 public health emergency.  Safety protocols were in place, including screening questions prior to the visit, additional usage of staff PPE, and extensive cleaning of exam room while observing appropriate contact time as indicated for disinfecting solutions.   Anthony Hendricks is a 24 year old male with a history of GAD and seborrheic dermatitis who presents today with a chief complaint of abdominal pain. He would also like a refill of his desonide.   His abdominal pain is located to the bilateral lower abdomen which is daily and constant for the last one month. Worse within 5-10 minutes of eating and a sensation of having to have diarrhea.   He denies bloody stools, changes in his diet, fevers, nausea with vomiting, right upper abdominal pain. He admits to a poor diet, drinks a lot of coffee and eats fast food daily.   He believes his symptoms may be secondary to his Lexapro, he's been taking Lexapro for the last four months, his pain began one month ago. His anxiety has significantly improved with Lexapro 20 mg.   Review of Systems  Constitutional: Negative for fever.  Gastrointestinal: Positive for abdominal pain and diarrhea. Negative for blood in stool, constipation, nausea and vomiting.       No past medical history on file.   Social History   Socioeconomic History  . Marital status: Married    Spouse name: Not on file  . Number of children: Not on file  . Years of education: Not on file  . Highest education level: Not on file  Occupational History  . Not on file  Tobacco Use  . Smoking status: Never Smoker  . Smokeless tobacco: Never Used  Substance and Sexual Activity  . Alcohol use: Yes    Alcohol/week: 0.0 standard drinks    Comment: social  . Drug use: No  . Sexual activity: Not on file  Other Topics Concern  . Not on file    Social History Narrative   Single.   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   Highest level of education 10th grade.   Enjoys playing basketball.   Social Determinants of Health   Financial Resource Strain:   . Difficulty of Paying Living Expenses:   Food Insecurity:   . Worried About Programme researcher, broadcasting/film/video in the Last Year:   . Barista in the Last Year:   Transportation Needs:   . Freight forwarder (Medical):   Marland Kitchen Lack of Transportation (Non-Medical):   Physical Activity:   . Days of Exercise per Week:   . Minutes of Exercise per Session:   Stress:   . Feeling of Stress :   Social Connections:   . Frequency of Communication with Friends and Family:   . Frequency of Social Gatherings with Friends and Family:   . Attends Religious Services:   . Active Member of Clubs or Organizations:   . Attends Banker Meetings:   Marland Kitchen Marital Status:   Intimate Partner Violence:   . Fear of Current or Ex-Partner:   . Emotionally Abused:   Marland Kitchen Physically Abused:   . Sexually Abused:     Past Surgical History:  Procedure Laterality Date  . TONSILLECTOMY AND ADENOIDECTOMY      Family History  Problem Relation Age of Onset  . Diabetes Mother   .  Hypertension Mother   . Diabetes Sister   . Hypertension Maternal Grandmother   . Diabetes Maternal Grandmother   . Diabetes Brother     No Known Allergies  Current Outpatient Medications on File Prior to Visit  Medication Sig Dispense Refill  . ALPRAZolam (XANAX) 0.25 MG tablet Take 1 tablet (0.25 mg total) by mouth daily as needed for anxiety. 10 tablet 0  . escitalopram (LEXAPRO) 20 MG tablet Take 1 tablet (20 mg total) by mouth daily. For anxiety. 90 tablet 3   No current facility-administered medications on file prior to visit.    BP 112/76   Pulse 70   Temp (!) 95.6 F (35.3 C) (Temporal)   Ht 5\' 11"  (1.803 m)   Wt 192 lb 8 oz (87.3 kg)   SpO2 98%   BMI 26.85 kg/m    Objective:   Physical Exam   Constitutional: He does not appear ill.  Cardiovascular: Normal rate.  GI: Soft. Bowel sounds are normal. There is abdominal tenderness in the right lower quadrant and periumbilical area. There is guarding.  Skin: Skin is warm and dry.  Psychiatric: Mood normal.           Assessment & Plan:

## 2020-05-10 ENCOUNTER — Ambulatory Visit (INDEPENDENT_AMBULATORY_CARE_PROVIDER_SITE_OTHER): Payer: BC Managed Care – PPO | Admitting: Primary Care

## 2020-05-10 ENCOUNTER — Other Ambulatory Visit: Payer: Self-pay

## 2020-05-10 ENCOUNTER — Encounter: Payer: Self-pay | Admitting: Primary Care

## 2020-05-10 ENCOUNTER — Telehealth: Payer: Self-pay | Admitting: Primary Care

## 2020-05-10 VITALS — BP 122/84 | HR 84 | Temp 98.9°F | Ht 71.0 in | Wt 192.0 lb

## 2020-05-10 DIAGNOSIS — L239 Allergic contact dermatitis, unspecified cause: Secondary | ICD-10-CM | POA: Insufficient documentation

## 2020-05-10 MED ORDER — PREDNISONE 20 MG PO TABS: ORAL_TABLET | ORAL | 0 refills | Status: AC

## 2020-05-10 NOTE — Progress Notes (Signed)
   Subjective:    Patient ID: Anthony Hendricks, male    DOB: September 25, 1995, 24 y.o.   MRN: [id number]  HPI  This visit occurred during the SARS-CoV-2 public health emergency.  Safety protocols were in place, including screening questions prior to the visit, additional usage of staff PPE, and extensive cleaning of exam room while observing appropriate contact time as indicated for disinfecting solutions.   Anthony Hendricks is a 25 year old with a history of eczema & GAD who presents today with chief complaint of face rash.  He first noticed this first Monday night, after taking a shower it made the redness worse. The next day the rash began to spread across forehead. He describes this rash as an irritation and itching. He also noticed after getting out of the shower this rash is under his scrotum.    He is currently using a hydrocortisone cream on face for his chronic eczema but has not put anything on his face besides water since the onset of this rash. He works at a [employer], [employer] and midday Monday he slashed a chemical onto his left forehead and scalp, he did not think he got anything on his face at that time. He also reports pulling weeds from his garden on Sunday afternoon. He denies any visual changes, no eye drainage. No shortness of breath or chest pain.      BP Readings from Last 3 Encounters:  05/10/20 122/84  11/26/19 112/76  08/30/19 124/84     Review of Systems  Constitutional: Negative.   HENT: Negative.   Eyes: Negative.  Negative for pain, discharge, redness and itching.  Respiratory: Negative.  Negative for chest tightness and shortness of breath.   Cardiovascular: Negative.   Skin: Positive for color change and rash.       Objective:   Physical Exam HENT:     Right Ear: Tympanic membrane normal.     Left Ear: Tympanic membrane normal.     Nose: Nose normal.  Eyes:     Pupils: Pupils are equal, round, and reactive to light.  Cardiovascular:     Rate and Rhythm:  Normal rate.     Pulses: Normal pulses.  Pulmonary:     Effort: Pulmonary effort is normal.     Breath sounds: Normal breath sounds.  Musculoskeletal:        General: Normal range of motion.     Cervical back: Normal range of motion.  Skin:    General: Skin is warm.     Capillary Refill: Capillary refill takes less than 2 seconds.       Neurological:     General: No focal deficit present.     Mental Status: He is alert and oriented to person, place, and time.  Psychiatric:        Mood and Affect: Mood normal.           Assessment & Plan:

## 2020-05-10 NOTE — Assessment & Plan Note (Addendum)
Acute red raised rash that began under right eye 2 days ago and spread across forehead. Unclear exact etiology does have known exposure to chemical from work as well as environmental exposure in garden.   Suspect allergic vs contact dermatitis will treat with prednisone taper.   He will update.  Agree with assessment and plan. Doreene Nest, NP

## 2020-05-10 NOTE — Telephone Encounter (Signed)
Patient called to see if the prednisone was sent into [facility]. He is waiting at [facility] for this to be sent. EM

## 2020-05-10 NOTE — Telephone Encounter (Signed)
Called pharmacy they have filled called patient and l/m to let him know.

## 2020-05-10 NOTE — Patient Instructions (Signed)
Please start prednisone 20mg  take 2 tablets by mouth for 3 days then take 1 tablets by mouth for 3 days.    Contact Dermatitis Dermatitis is redness, soreness, and swelling (inflammation) of the skin. Contact dermatitis is a reaction to something that touches the skin. There are two types of contact dermatitis:  Irritant contact dermatitis. This happens when something bothers (irritates) your skin, like soap.  Allergic contact dermatitis. This is caused when you are exposed to something that you are allergic to, such as poison ivy. What are the causes?  Common causes of irritant contact dermatitis include: ? Makeup. ? Soaps. ? Detergents. ? Bleaches. ? Acids. ? Metals, such as nickel.  Common causes of allergic contact dermatitis include: ? Plants. ? Chemicals. ? Jewelry. ? Latex. ? Medicines. ? Preservatives in products, such as clothing. What increases the risk?  Having a job that exposes you to things that bother your skin.  Having asthma or eczema. What are the signs or symptoms? Symptoms may happen anywhere the irritant has touched your skin. Symptoms include:  Dry or flaky skin.  Redness.  Cracks.  Itching.  Pain or a burning feeling.  Blisters.  Blood or clear fluid draining from skin cracks. With allergic contact dermatitis, swelling may occur. This may happen in places such as the eyelids, mouth, or genitals. How is this treated?  This condition is treated by checking for the cause of the reaction and protecting your skin. Treatment may also include: ? Steroid creams, ointments, or medicines. ? Antibiotic medicines or other ointments, if you have a skin infection. ? Lotion or medicines to help with itching. ? A bandage (dressing). Follow these instructions at home: Skin care  Moisturize your skin as needed.  Put cool cloths on your skin.  Put a baking soda paste on your skin. Stir water into baking soda until it looks like a paste.  Do not  scratch your skin.  Avoid having things rub up against your skin.  Avoid the use of soaps, perfumes, and dyes. Medicines  Take or apply over-the-counter and prescription medicines only as told by your doctor.  If you were prescribed an antibiotic medicine, take or apply it as told by your doctor. Do not stop using it even if your condition starts to get better. Bathing  Take a bath with: ? Epsom salts. ? Baking soda. ? Colloidal oatmeal.  Bathe less often.  Bathe in warm water. Avoid using hot water. Bandage care  If you were given a bandage, change it as told by your health care provider.  Wash your hands with soap and water before and after you change your bandage. If soap and water are not available, use hand sanitizer. General instructions  Avoid the things that caused your reaction. If you do not know what caused it, keep a journal. Write down: ? What you eat. ? What skin products you use. ? What you drink. ? What you wear in the area that has symptoms. This includes jewelry.  Check the affected areas every day for signs of infection. Check for: ? More redness, swelling, or pain. ? More fluid or blood. ? Warmth. ? Pus or a bad smell.  Keep all follow-up visits as told by your doctor. This is important. Contact a doctor if:  You do not get better with treatment.  Your condition gets worse.  You have signs of infection, such as: ? More swelling. ? Tenderness. ? More redness. ? Soreness. ? Warmth.  You have  a fever.  You have new symptoms. Get help right away if:  You have a very bad headache.  You have neck pain.  Your neck is stiff.  You throw up (vomit).  You feel very sleepy.  You see red streaks coming from the area.  Your bone or joint near the area hurts after the skin has healed.  The area turns darker.  You have trouble breathing. Summary  Dermatitis is redness, soreness, and swelling of the skin.  Symptoms may occur where the  irritant has touched you.  Treatment may include medicines and skin care.  If you do not know what caused your reaction, keep a journal.  Contact a doctor if your condition gets worse or you have signs of infection. This information is not intended to replace advice given to you by your health care provider. Make sure you discuss any questions you have with your health care provider. Document Revised: 09/23/2018 Document Reviewed: 12/17/2017 Elsevier Patient Education  2020 [employer].

## 2020-05-10 NOTE — Progress Notes (Signed)
Subjective:    Patient ID: Anthony Hendricks, male    DOB: 1996-04-08, 24 y.o.   MRN: [id number]  HPI  This visit occurred during the SARS-CoV-2 public health emergency.  Safety protocols were in place, including screening questions prior to the visit, additional usage of staff PPE, and extensive cleaning of exam room while observing appropriate contact time as indicated for disinfecting solutions.   Anthony Hendricks is a 24 year old male with a history of seborrheic dermatitis, tinea corporis who presents today with a chief complaint of facial rash.  He initially noticed the rash 2 days ago after showering.  The rash is itchy and burns.  He works with chemicals at a water treatment plant and remember splashing chemical onto his left forearm and scalp earlier that day before his rash began.  He does not remember the chemical splashing onto his forehead or cheeks.  The day prior to his rash he was out in the garden pulling out weeds.  He denies changes in soaps, detergents, medications, foods.  He has not applied anything topically to the area.  Review of Systems  Constitutional: Negative for fever.  Skin: Positive for color change and rash.       Past Medical History:  Diagnosis Date  . Hypertension      Social History   Socioeconomic History  . Marital status: Married    Spouse name: Not on file  . Number of children: Not on file  . Years of education: Not on file  . Highest education level: Not on file  Occupational History  . Not on file  Tobacco Use  . Smoking status: Never Smoker  . Smokeless tobacco: Never Used  Substance and Sexual Activity  . Alcohol use: Yes    Alcohol/week: 0.0 standard drinks    Comment: social  . Drug use: No  . Sexual activity: Not on file  Other Topics Concern  . Not on file  Social History Narrative   Single.   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   Highest level of education 10th grade.   Enjoys playing basketball.   Social Determinants of  Health   Financial Resource Strain:   . Difficulty of Paying Living Expenses: Not on file  Food Insecurity:   . Worried About Programme researcher, broadcasting/film/video in the Last Year: Not on file  . Ran Out of Food in the Last Year: Not on file  Transportation Needs:   . Lack of Transportation (Medical): Not on file  . Lack of Transportation (Non-Medical): Not on file  Physical Activity:   . Days of Exercise per Week: Not on file  . Minutes of Exercise per Session: Not on file  Stress:   . Feeling of Stress : Not on file  Social Connections:   . Frequency of Communication with Friends and Family: Not on file  . Frequency of Social Gatherings with Friends and Family: Not on file  . Attends Religious Services: Not on file  . Active Member of Clubs or Organizations: Not on file  . Attends Banker Meetings: Not on file  . Marital Status: Not on file  Intimate Partner Violence:   . Fear of Current or Ex-Partner: Not on file  . Emotionally Abused: Not on file  . Physically Abused: Not on file  . Sexually Abused: Not on file    Past Surgical History:  Procedure Laterality Date  . TONSILLECTOMY AND ADENOIDECTOMY      Family History  Problem Relation Age  of Onset  . Diabetes Mother   . Hypertension Mother   . Diabetes Sister   . Hypertension Maternal Grandmother   . Diabetes Maternal Grandmother   . Diabetes Brother     No Known Allergies  Current Outpatient Medications on File Prior to Visit  Medication Sig Dispense Refill  . desonide (DESOWEN) 0.05 % cream APPLY TO AFFECTED AREA TWICE DAILY AS NEEDED(FACE) (Patient not taking: Reported on 05/10/2020) 30 g 0  . sertraline (ZOLOFT) 50 MG tablet Take 1 tablet (50 mg total) by mouth daily. For anxiety. (Patient not taking: Reported on 05/10/2020) 90 tablet 0   No current facility-administered medications on file prior to visit.    BP 122/84 (BP Location: Left Arm, Patient Position: Sitting, Cuff Size: Normal)   Pulse 84   Temp  98.9 F (37.2 C)   Ht 5\' 11"  (1.803 m)   Wt 192 lb (87.1 kg)   SpO2 99%   BMI 26.78 kg/m    Objective:   Physical Exam Cardiovascular:     Rate and Rhythm: Normal rate and regular rhythm.  Pulmonary:     Effort: Pulmonary effort is normal.     Breath sounds: Normal breath sounds. No wheezing.  Skin:    General: Skin is warm and dry.     Findings: Rash present.     Comments: Erythematous rash noted to bilateral cheeks, forehead, right eyelid.  Mild swelling to right eyelid.  Neurological:     Mental Status: He is alert.            Assessment & Plan:

## 2021-02-10 DIAGNOSIS — R21 Rash and other nonspecific skin eruption: Secondary | ICD-10-CM | POA: Diagnosis not present

## 2021-02-10 DIAGNOSIS — W57XXXA Bitten or stung by nonvenomous insect and other nonvenomous arthropods, initial encounter: Secondary | ICD-10-CM | POA: Diagnosis not present

## 2021-02-10 DIAGNOSIS — L719 Rosacea, unspecified: Secondary | ICD-10-CM | POA: Diagnosis not present

## 2021-02-27 DIAGNOSIS — R21 Rash and other nonspecific skin eruption: Secondary | ICD-10-CM | POA: Diagnosis not present

## 2021-02-27 DIAGNOSIS — L719 Rosacea, unspecified: Secondary | ICD-10-CM | POA: Diagnosis not present

## 2021-02-28 DIAGNOSIS — R21 Rash and other nonspecific skin eruption: Secondary | ICD-10-CM | POA: Diagnosis not present

## 2021-07-30 IMAGING — CT CT ABD-PELV W/ CM
2 of 4 series · 15 of 46 positions shown, 17 images · IV contrast (OMNIPAQUE 300)
Comparison: None.

CLINICAL DATA: Lower abdominal pain, primarily right-sided

EXAM:
CT ABDOMEN AND PELVIS WITH CONTRAST
TECHNIQUE: Multidetector CT imaging of the abdomen and pelvis was performed
using the standard protocol following bolus administration of
intravenous contrast. Oral contrast was also administered.
CONTRAST:  100mL OMNIPAQUE IOHEXOL 300 MG/ML  SOLN

[Series 3: abd/pel w · axial · 0.71mm/px · z∈[-526,-56]mm · 12 of 104 slices shown, 14 images]
[im 5/104  soft-tissue]
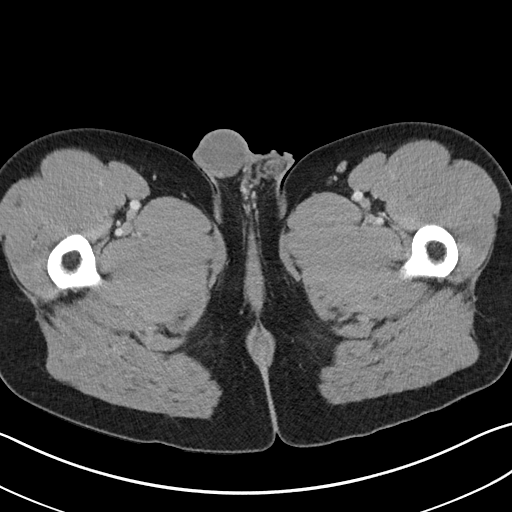
[im 5/104  bone]
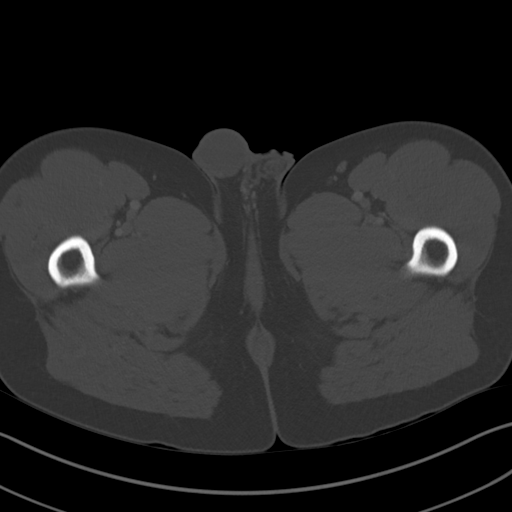
[im 13/104  soft-tissue]
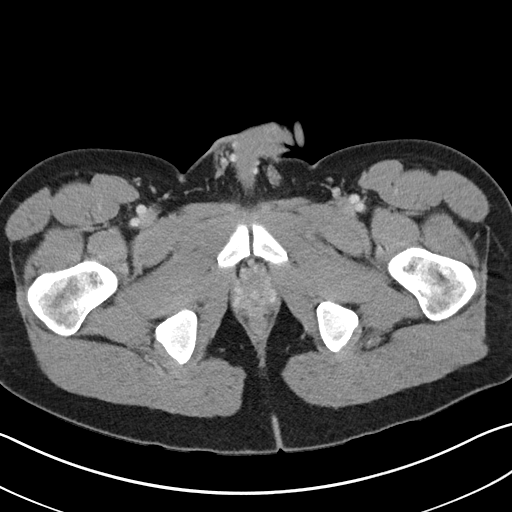
[im 22/104  soft-tissue]
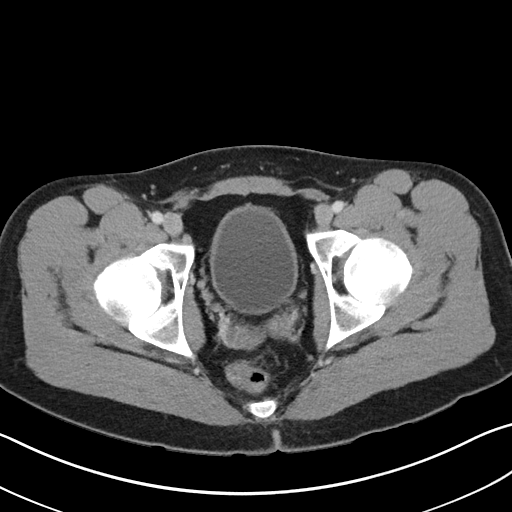
[im 31/104  soft-tissue]
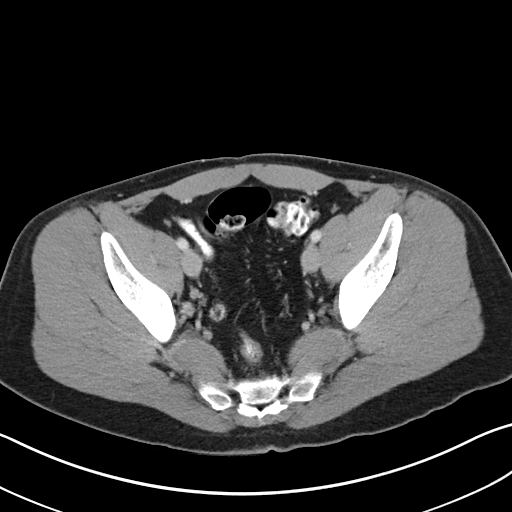
[im 39/104  soft-tissue]
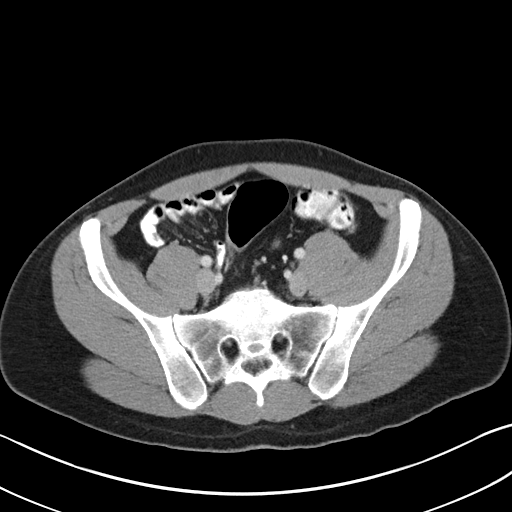
[im 48/104  soft-tissue]
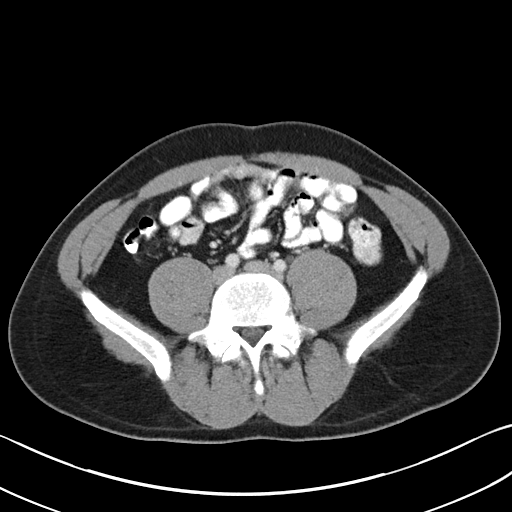
[im 56/104  soft-tissue]
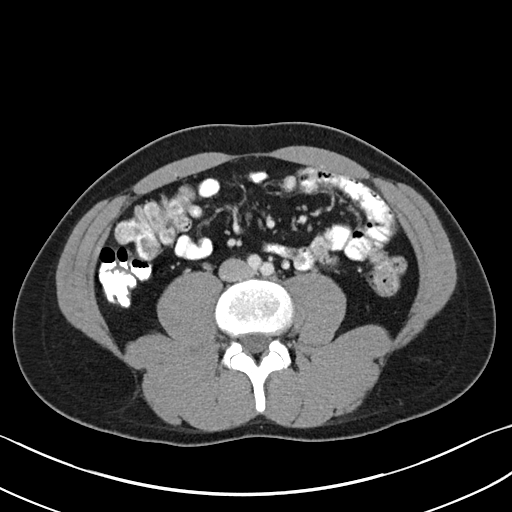
[im 65/104  soft-tissue]
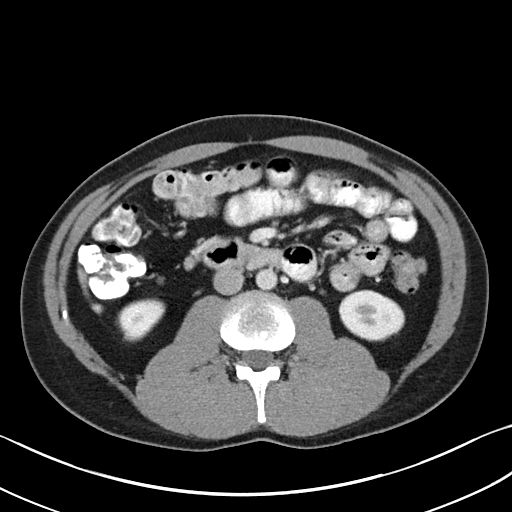
[im 73/104  soft-tissue]
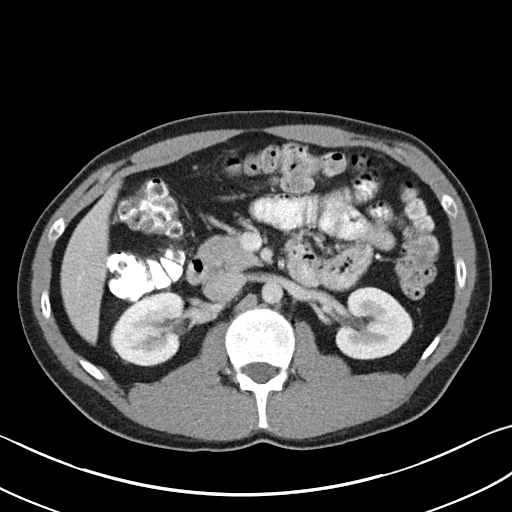
[im 73/104  bone]
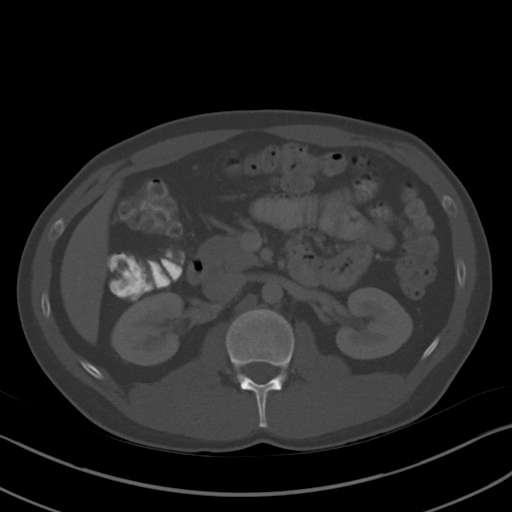
[im 82/104  soft-tissue]
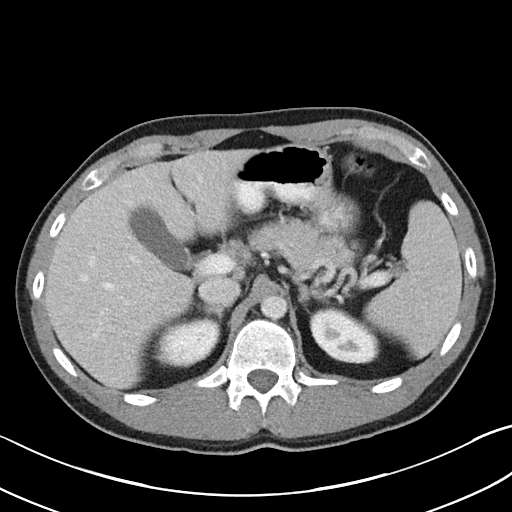
[im 91/104  soft-tissue]
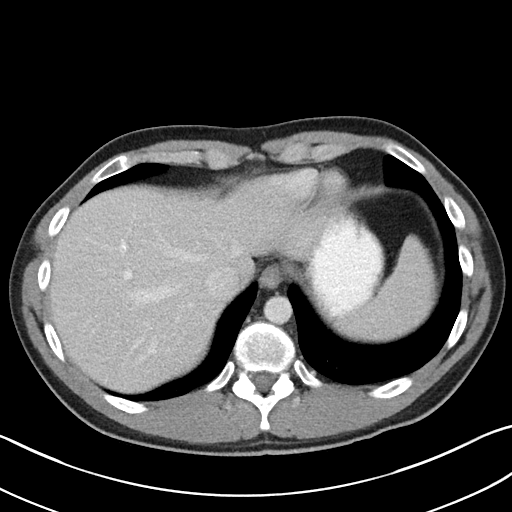
[im 99/104  soft-tissue]
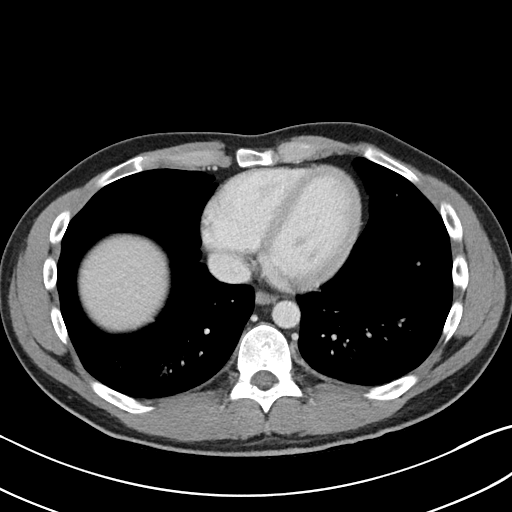

[Series 6: abd/pel w st · coronal · 0.78mm/px · 3 of 78 slices shown]
[im 26/78  soft-tissue]
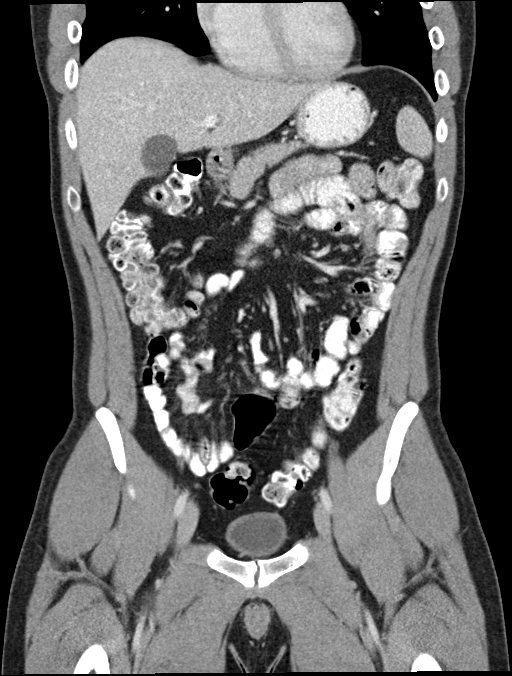
[im 35/78  soft-tissue]
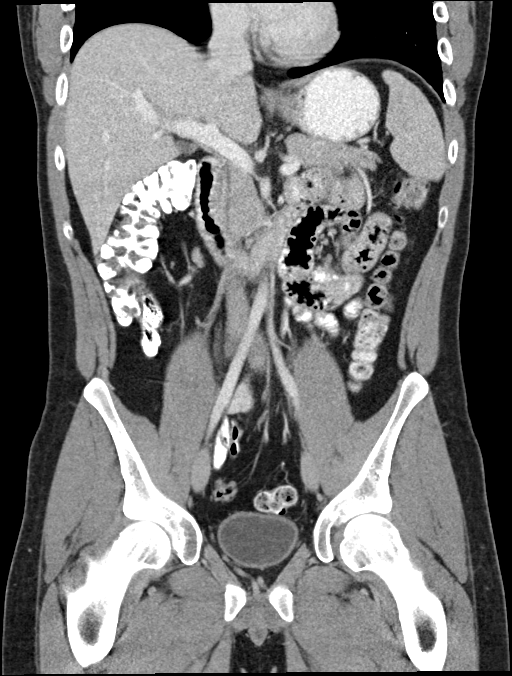
[im 43/78  soft-tissue]
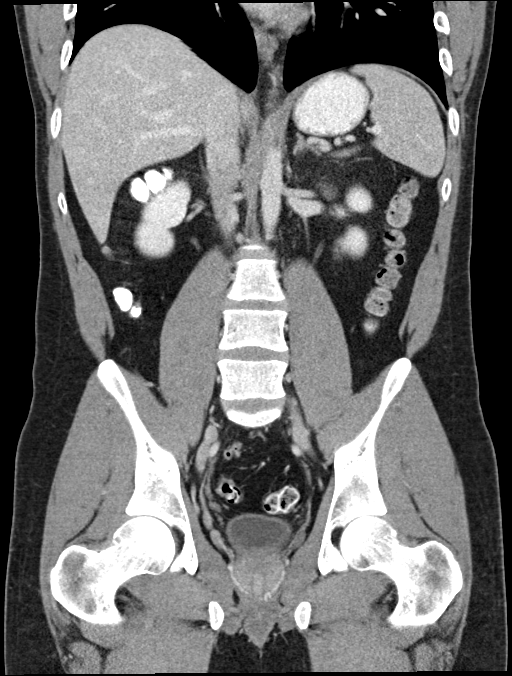

[15 of 46 positions shown; findings below may reference images not displayed]

FINDINGS: Lower chest: Lung bases are clear.

Hepatobiliary: No focal liver lesions are appreciable. Gallbladder
wall is not appreciably thickened. There is no biliary duct
dilatation.

Pancreas: There is no pancreatic mass or inflammatory focus.

Spleen: No splenic lesions are evident.

Adrenals/Urinary Tract: Adrenals bilaterally appear normal. Kidneys
bilaterally show no evident mass or hydronephrosis on either side.
There is no renal or ureteral calculus on either side. Urinary
bladder is midline with wall thickness within normal limits.

Stomach/Bowel: There is no appreciable bowel wall or mesenteric
thickening. No evident bowel obstruction. The terminal ileum appears
normal. There is no demonstrable free air or portal venous air.

Vascular/Lymphatic: There is no abdominal aortic aneurysm. No
arterial vascular lesions are evident. Major venous structures
appear patent. There is no evident adenopathy in the abdomen or
pelvis by size criteria. There are occasional mesenteric lymph nodes
in the right lower abdomen, regarded as nonspecific.

Reproductive: Prostate and seminal vesicles are normal in size and
contour. No pelvic mass evident.

Other: The appendix appears normal. No abscess or ascites evident in
the abdomen or pelvis.

Musculoskeletal: No blastic or lytic bone lesions. No intramuscular
or abdominal wall lesions are evident.
IMPRESSION: 1. Normal appearing appendix. No bowel wall thickening or bowel
obstruction. No abscess in the abdomen or pelvis.

2. No renal or ureteral calculus. No hydronephrosis. Urinary bladder
wall thickness normal.

3. No adenopathy by size criteria. Subcentimeter lymph nodes in the
right abdomen are considered nonspecific. In the appropriate
clinical setting, these lymph nodes could be indicative of a degree
of mesenteric adenitis.
